# Patient Record
Sex: Female | Born: 1975 | Hispanic: Yes | Marital: Married | Smoking: Never smoker
Health system: Southern US, Community
[De-identification: ages and names within clinical notes are randomized; demographics above are authoritative.]

## PROBLEM LIST (undated history)

## (undated) DIAGNOSIS — J45909 Unspecified asthma, uncomplicated: Secondary | ICD-10-CM

## (undated) DIAGNOSIS — N920 Excessive and frequent menstruation with regular cycle: Secondary | ICD-10-CM

## (undated) DIAGNOSIS — D649 Anemia, unspecified: Secondary | ICD-10-CM

## (undated) DIAGNOSIS — T7840XA Allergy, unspecified, initial encounter: Secondary | ICD-10-CM

## (undated) HISTORY — DX: Anemia, unspecified: D64.9

## (undated) HISTORY — DX: Excessive and frequent menstruation with regular cycle: N92.0

---

## 2009-07-29 ENCOUNTER — Other Ambulatory Visit: Admission: RE | Admit: 2009-07-29 | Discharge: 2009-07-29 | Payer: Self-pay | Admitting: Gynecology

## 2009-07-29 ENCOUNTER — Ambulatory Visit: Payer: Self-pay | Admitting: Gynecology

## 2010-08-04 ENCOUNTER — Other Ambulatory Visit (HOSPITAL_COMMUNITY)
Admission: RE | Admit: 2010-08-04 | Discharge: 2010-08-04 | Disposition: A | Discharge status: Home / Self Care | Payer: 59 | Source: Ambulatory Visit | Attending: Gynecology | Admitting: Gynecology

## 2010-08-04 ENCOUNTER — Ambulatory Visit
Admission: RE | Admit: 2010-08-04 | Discharge: 2010-08-04 | Payer: Self-pay | Source: Home / Self Care | Attending: Gynecology | Admitting: Gynecology

## 2010-08-04 ENCOUNTER — Other Ambulatory Visit: Payer: Self-pay | Admitting: Gynecology

## 2010-08-04 DIAGNOSIS — Z124 Encounter for screening for malignant neoplasm of cervix: Secondary | ICD-10-CM | POA: Insufficient documentation

## 2011-08-07 ENCOUNTER — Encounter: Payer: 59 | Admitting: Gynecology

## 2011-09-19 ENCOUNTER — Encounter: Payer: 59 | Admitting: Gynecology

## 2011-10-12 ENCOUNTER — Encounter: Payer: 59 | Admitting: Gynecology

## 2011-11-09 ENCOUNTER — Encounter: Payer: 59 | Admitting: Gynecology

## 2012-01-24 ENCOUNTER — Encounter: Payer: 59 | Admitting: Gynecology

## 2012-03-17 ENCOUNTER — Encounter: Payer: 59 | Admitting: Gynecology

## 2012-04-15 ENCOUNTER — Encounter: Payer: 59 | Admitting: Gynecology

## 2012-05-16 ENCOUNTER — Encounter: Payer: 59 | Admitting: Gynecology

## 2012-12-19 ENCOUNTER — Encounter: Payer: Self-pay | Admitting: Gynecology

## 2013-01-22 ENCOUNTER — Encounter: Payer: Self-pay | Admitting: Gynecology

## 2013-02-18 ENCOUNTER — Encounter: Payer: Self-pay | Admitting: Gynecology

## 2013-10-21 ENCOUNTER — Ambulatory Visit (INDEPENDENT_AMBULATORY_CARE_PROVIDER_SITE_OTHER): Payer: 59 | Admitting: Gynecology

## 2013-10-21 ENCOUNTER — Other Ambulatory Visit (HOSPITAL_COMMUNITY)
Admission: RE | Admit: 2013-10-21 | Discharge: 2013-10-21 | Disposition: A | Discharge status: Home / Self Care | Payer: 59 | Source: Ambulatory Visit | Attending: Gynecology | Admitting: Gynecology

## 2013-10-21 ENCOUNTER — Encounter: Payer: Self-pay | Admitting: Gynecology

## 2013-10-21 VITALS — BP 126/84 | Ht 70.0 in | Wt 186.0 lb

## 2013-10-21 DIAGNOSIS — Z01419 Encounter for gynecological examination (general) (routine) without abnormal findings: Secondary | ICD-10-CM

## 2013-10-21 DIAGNOSIS — N841 Polyp of cervix uteri: Secondary | ICD-10-CM

## 2013-10-21 DIAGNOSIS — Z1151 Encounter for screening for human papillomavirus (HPV): Secondary | ICD-10-CM | POA: Insufficient documentation

## 2013-10-21 LAB — CBC WITH DIFFERENTIAL/PLATELET
Basophils Absolute: 0.1 10*3/uL (ref 0.0–0.1)
Basophils Relative: 2 % — ABNORMAL HIGH (ref 0–1)
Eosinophils Absolute: 0.2 10*3/uL (ref 0.0–0.7)
Eosinophils Relative: 3 % (ref 0–5)
HCT: 28.4 % — ABNORMAL LOW (ref 36.0–46.0)
Hemoglobin: 9 g/dL — ABNORMAL LOW (ref 12.0–15.0)
LYMPHS ABS: 2.1 10*3/uL (ref 0.7–4.0)
LYMPHS PCT: 40 % (ref 12–46)
MCH: 21.4 pg — ABNORMAL LOW (ref 26.0–34.0)
MCHC: 31.7 g/dL (ref 30.0–36.0)
MCV: 67.6 fL — ABNORMAL LOW (ref 78.0–100.0)
Monocytes Absolute: 0.4 10*3/uL (ref 0.1–1.0)
Monocytes Relative: 8 % (ref 3–12)
NEUTROS ABS: 2.4 10*3/uL (ref 1.7–7.7)
NEUTROS PCT: 47 % (ref 43–77)
PLATELETS: 356 10*3/uL (ref 150–400)
RBC: 4.2 MIL/uL (ref 3.87–5.11)
RDW: 18 % — ABNORMAL HIGH (ref 11.5–15.5)
WBC: 5.2 10*3/uL (ref 4.0–10.5)

## 2013-10-21 LAB — COMPREHENSIVE METABOLIC PANEL
ALBUMIN: 3.9 g/dL (ref 3.5–5.2)
ALK PHOS: 68 U/L (ref 39–117)
ALT: 10 U/L (ref 0–35)
AST: 14 U/L (ref 0–37)
BUN: 10 mg/dL (ref 6–23)
CO2: 28 mEq/L (ref 19–32)
Calcium: 8.9 mg/dL (ref 8.4–10.5)
Chloride: 105 mEq/L (ref 96–112)
Creat: 0.75 mg/dL (ref 0.50–1.10)
Glucose, Bld: 79 mg/dL (ref 70–99)
POTASSIUM: 4 meq/L (ref 3.5–5.3)
SODIUM: 138 meq/L (ref 135–145)
TOTAL PROTEIN: 6.7 g/dL (ref 6.0–8.3)
Total Bilirubin: 0.4 mg/dL (ref 0.2–1.2)

## 2013-10-21 LAB — CHOLESTEROL, TOTAL: CHOLESTEROL: 198 mg/dL (ref 0–200)

## 2013-10-21 LAB — TSH: TSH: 1.693 u[IU]/mL (ref 0.350–4.500)

## 2013-10-21 NOTE — Progress Notes (Signed)
Denise Howell 08-03-1975 [id number]   History:    38 y.o.  for annual gyn exam  Who has not been seen in the office in over 3 years. Patient reports normal menstrual cycle. 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 Her husband has had a vasectomy.  Past medical history,surgical history, family history and social history were all reviewed and documented in the EPIC chart.  Gynecologic History Patient's last menstrual period was 10/17/2013. Contraception: vasectomy Last Pap: 2012. Results were: normal Last mammogram: Not indicated. Results were: Not indicated  Obstetric History OB History  Gravida Para Term Preterm AB SAB TAB Ectopic Multiple Living  2 2        2     # Outcome Date GA Lbr Len/2nd Weight Sex Delivery Anes PTL Lv  2 PAR           1 PAR                ROS: A ROS was performed and pertinent positives and negatives are included in the history.  GENERAL: No fevers or chills. HEENT: No change in vision, no earache, sore throat or sinus congestion. NECK: No pain or stiffness. CARDIOVASCULAR: No chest pain or pressure. No palpitations. PULMONARY: No shortness of breath, cough or wheeze. GASTROINTESTINAL: No abdominal pain, nausea, vomiting or diarrhea, melena or bright red blood per rectum. GENITOURINARY: No urinary frequency, urgency, hesitancy or dysuria. MUSCULOSKELETAL: No joint or muscle pain, no back pain, no recent trauma. DERMATOLOGIC: No rash, no itching, no lesions. ENDOCRINE: No polyuria, polydipsia, no heat or cold intolerance. No recent change in weight. HEMATOLOGICAL: No anemia or easy bruising or bleeding. NEUROLOGIC: No headache, seizures, numbness, tingling or weakness. PSYCHIATRIC: No depression, no loss of interest in normal activity or change in sleep pattern.     Exam: chaperone present  BP 126/84  Ht 5\' 10"  (1.778 m)  Wt 186 lb (84.369 kg)  BMI 26.69  kg/m2  LMP 10/17/2013  Body mass index is 26.69 kg/(m^2).  General appearance : Well developed well nourished female. No acute distress HEENT: Neck supple, trachea midline, no carotid bruits, no thyroidmegaly Lungs: Clear to auscultation, no rhonchi or wheezes, or rib retractions  Heart: Regular rate and rhythm, no murmurs or gallops Breast:Examined in sitting and supine position were symmetrical in appearance, no palpable masses or tenderness,  no skin retraction, no nipple inversion, no nipple discharge, no skin discoloration, no axillary or supraclavicular lymphadenopathy Abdomen: no palpable masses or tenderness, no rebound or guarding Extremities: no edema or skin discoloration or tenderness  Pelvic:  Bartholin, Urethra, Skene Glands: Within normal limits             Vagina: No gross lesions or discharge  Cervix: No gross lesions or discharge, cervical polyp  Uterus  anteverted, normal size, shape and consistency, non-tender and mobile  Adnexa  Without masses or tenderness  Anus and perineum  normal   Rectovaginal  normal sphincter tone without palpated masses or tenderness             Hemoccult not indicated   The patient was counseled for removal of cervical polyp. The area was cleansed with Betadine solution. The polyp was grasped with a ring forceps and twisted off its pedicle. For hemostasis overnight treated and Monsel solution was used.  Assessment/Plan:  38 y.o. female for annual exam with incidental finding of a cervical  polyp which was removed and submitted for pathological evaluation. The following labs ordered today: Pap smear, CBC, screening cholesterol, TSH, comprehensive metabolic panel and urinalysis. Patient was reminded to do her monthly breast exam.  Note: This dictation was prepared with  Dragon/digital dictation along withSmart phrase technology. Any transcriptional errors that result from this process are unintentional.   Terrance Mass MD, 3:11 PM  10/21/2013

## 2013-10-21 NOTE — Patient Instructions (Signed)
Autoexamen de mamas  (Breast Self-Awareness) El autoexamen de mamas puede detectar problemas de manera temprana, prevenir complicaciones mdicas significativas y posiblemente salvar su vida. Al hacerlo, podr familiarizarse con el aspecto y forma de sus mamas, y observar cambios. Esto le permite descubrir cambios de manera precoz. Este autoexamen mensual le ofrece la tranquilidad de que sus senos estn en buen estado de salud. Una forma de aprender qu es normal para sus mamas y si sufren modificaciones es hacer un autoexamen.   Si encuentra un bulto o algo que no estaba presente anteriormente, lo mejor es ponerse en contacto con su mdico inmediatamente. Otro hallazgo que debe ser evaluado por su mdico es la secrecin del pezn, especialmente si es con sangre; cambios en la piel o enrojecimiento; reas donde la piel parece estar tironeada (retrada) o nuevos bultos o protuberancias. El dolor en los senos es rara vez se asocia con el cncer (malignidad), pero tambin debe ser evaluado por un mdico.  CMO REALIZAR EL AUTOEXAMEN DE MAMAS  El mejor momento para examinar sus mamas es a los 5 a 7 das despus de finalizado el perodo menstrual. Durante la menstruacin, las mamas estn ms abultadas y puede haber ms dificultad para encontrar modificaciones. Si no menstra, ha llegado a la menopausia, o le han extirpado el tero (histerectomia), usted debe examinar sus senos a intervalos regulares, por ejemplo cada mes. Si est amamantando, examine sus senos despus de alimentar al beb o despus de usar un extractor de leche. Los implantes mamarios no disminuyen el riesgo de bultos o tumores, por lo que debe seguir realizando el autoexamen de mamas como se recomienda. Hable con su mdico acerca de cmo determinar la diferencia entre el implante y el tejido mamario. Adems, debe consultar cuanta presin debe hacer durante el examen. Con el tiempo se familiarizar con las variaciones de las mamas y se sentir ms  cmoda para realizar el examen. Para el autoexamen deber quitarse toda la ropa de la cintura para arriba.  1.  Observe sus senos y pezones. Prese frente a un espejo en una habitacin con buena iluminacin. Con las manos en las caderas, presione las manos firmemente hacia abajo. Busque diferencias en la forma, el contorno y el tamao de un pecho al otro (asimetras). Entre las asimetras se incluyen arrugas, depresiones o protuberancias. Tambin, busque cambios en la piel, como reas enrojecidas o escamosas. Busque cambios en los pezones, como secreciones, hoyuelos, cambios en la posicin, o enrojecimiento. 2. Palpe cuidadosamente sus senos. Es mucho mejor hacerlo en la ducha o en la baera, mientras usa jabn o cuando est recostada sobre su espalda. Coloque el brazo (en el lado de la mama que se examina) por arriba de la cabeza. Use las yemas (no las puntas) de los tres dedos centrales de la mano opuesta para palpar. Comience en la zona de la axila, haga crculos de  de pulgada (2 cm) y vaya superponindolos. Utilice 3 niveles diferentes de presin (ligero, medio y firme) en cada crculo antes de pasar al siguiente. Se necesita una presin ligera para sentir los tejidos ms cercanos a la piel. La presin media ayudar a sentir el tejido mamario un poco ms profundo, mientras que se necesita una presin firme para palpar el tejido que se encuentra cerca de las costillas. Continuar superponiendo crculos y vaya hacia abajo, hasta sentir las costillas, por debajo del pecho. Luego mueva un espacio del ancho de un dedo hacia el centro del cuerpo. Siga con los crculos del  de pulgada (  2 cm) mientras va lentamente hacia la clavcula, cerca de la base del cuello. Contine con el examen hacia arriba y hacia abajo con las 3 intensidades de presin hasta llegar a la mitad del pecho. Hgalo con cada seno cuidadosamente, buscando bultos o modificaciones. 3. Debe llevar un registro escrito con los cambios o los hallazgos  normales que encuentre para cada seno. Si registra esta informacin, no tiene que depender slo de la memoria para recordar el tamao, la sensibilidad o la ubicacin de los hallazgos. Anote en qu momento se encuentra del ciclo menstrual, si usted todava est menstruando. El tejido mamario puede tener algunos bultos o tejidos engrosados. Sin embargo, consulte a su mdico si usted encuentra algo que le preocupa.   SOLICITE ATENCIN MDICA SI:   Observa cambios en la forma, en el contorno o el tamao de las mamas o los pezones.   Hay modificaciones en la piel, como zonas enrojecidas o escamosas en las mamas o en los pezones.   Tiene una secrecin anormal en los pezones.   Siente un nuevo bulto o reas engrosadas de manera anormal.  Document Released: 06/25/2005 Document Revised: 06/11/2012 [employer] Patient Information 2014 [employer].  

## 2013-10-22 ENCOUNTER — Other Ambulatory Visit: Payer: Self-pay | Admitting: Gynecology

## 2013-10-22 DIAGNOSIS — D649 Anemia, unspecified: Secondary | ICD-10-CM

## 2013-10-22 LAB — URINALYSIS W MICROSCOPIC + REFLEX CULTURE
BACTERIA UA: NONE SEEN
BILIRUBIN URINE: NEGATIVE
CRYSTALS: NONE SEEN
Casts: NONE SEEN
Glucose, UA: NEGATIVE mg/dL
KETONES UR: NEGATIVE mg/dL
Leukocytes, UA: NEGATIVE
Nitrite: NEGATIVE
Protein, ur: NEGATIVE mg/dL
SPECIFIC GRAVITY, URINE: 1.02 (ref 1.005–1.030)
UROBILINOGEN UA: 0.2 mg/dL (ref 0.0–1.0)
pH: 6 (ref 5.0–8.0)

## 2013-10-23 ENCOUNTER — Other Ambulatory Visit: Payer: 59

## 2013-10-23 DIAGNOSIS — D649 Anemia, unspecified: Secondary | ICD-10-CM

## 2013-10-23 LAB — CBC WITH DIFFERENTIAL/PLATELET
BASOS ABS: 0.1 10*3/uL (ref 0.0–0.1)
Basophils Relative: 2 % — ABNORMAL HIGH (ref 0–1)
Eosinophils Absolute: 0.4 10*3/uL (ref 0.0–0.7)
Eosinophils Relative: 8 % — ABNORMAL HIGH (ref 0–5)
HCT: 29.8 % — ABNORMAL LOW (ref 36.0–46.0)
Hemoglobin: 9.2 g/dL — ABNORMAL LOW (ref 12.0–15.0)
Lymphocytes Relative: 36 % (ref 12–46)
Lymphs Abs: 1.9 10*3/uL (ref 0.7–4.0)
MCH: 21.1 pg — ABNORMAL LOW (ref 26.0–34.0)
MCHC: 30.9 g/dL (ref 30.0–36.0)
MCV: 68.5 fL — AB (ref 78.0–100.0)
MONO ABS: 0.4 10*3/uL (ref 0.1–1.0)
Monocytes Relative: 7 % (ref 3–12)
Neutro Abs: 2.5 10*3/uL (ref 1.7–7.7)
Neutrophils Relative %: 47 % (ref 43–77)
PLATELETS: 375 10*3/uL (ref 150–400)
RBC: 4.35 MIL/uL (ref 3.87–5.11)
RDW: 17.5 % — AB (ref 11.5–15.5)
WBC: 5.3 10*3/uL (ref 4.0–10.5)

## 2013-10-23 LAB — VITAMIN B12: Vitamin B-12: 582 pg/mL (ref 211–911)

## 2013-10-23 LAB — URINE CULTURE: Colony Count: 25000

## 2013-10-23 LAB — IRON AND TIBC
%SAT: 3 % — ABNORMAL LOW (ref 20–55)
IRON: 13 ug/dL — AB (ref 42–145)
TIBC: 442 ug/dL (ref 250–470)
UIBC: 429 ug/dL — ABNORMAL HIGH (ref 125–400)

## 2013-10-23 LAB — FOLATE: Folate: 12.5 ng/mL

## 2013-10-23 LAB — FERRITIN: Ferritin: 1 ng/mL — ABNORMAL LOW (ref 10–291)

## 2013-10-27 ENCOUNTER — Other Ambulatory Visit: Payer: Self-pay | Admitting: Gynecology

## 2013-10-27 DIAGNOSIS — D649 Anemia, unspecified: Secondary | ICD-10-CM

## 2013-11-17 ENCOUNTER — Other Ambulatory Visit: Payer: 59 | Admitting: Anesthesiology

## 2013-11-17 DIAGNOSIS — Z1211 Encounter for screening for malignant neoplasm of colon: Secondary | ICD-10-CM

## 2014-01-15 ENCOUNTER — Other Ambulatory Visit: Payer: 59

## 2014-05-10 ENCOUNTER — Encounter: Payer: Self-pay | Admitting: Gynecology

## 2014-10-25 ENCOUNTER — Encounter: Payer: Self-pay | Admitting: Gynecology

## 2014-11-11 ENCOUNTER — Encounter: Payer: Self-pay | Admitting: Gynecology

## 2014-12-17 ENCOUNTER — Encounter: Payer: Self-pay | Admitting: Gynecology

## 2014-12-24 ENCOUNTER — Encounter: Payer: Self-pay | Admitting: Gynecology

## 2014-12-24 ENCOUNTER — Ambulatory Visit (INDEPENDENT_AMBULATORY_CARE_PROVIDER_SITE_OTHER): Payer: 59 | Admitting: Gynecology

## 2014-12-24 VITALS — BP 128/80 | Ht 71.0 in | Wt 191.0 lb

## 2014-12-24 DIAGNOSIS — R635 Abnormal weight gain: Secondary | ICD-10-CM

## 2014-12-24 DIAGNOSIS — Z01419 Encounter for gynecological examination (general) (routine) without abnormal findings: Secondary | ICD-10-CM

## 2014-12-24 NOTE — Progress Notes (Signed)
Denise Howell 21-Jan-1976 [id number]   History:    39 y.o.  for annual gyn exam with no complaints today. Patient stated many years ago she had HPV on on one of her Pap smear and subsequent patches of been normal. Her husband has had a vasectomy. She reports normal menstrual cycles.  Past medical history,surgical history, family history and social history were all reviewed and documented in the EPIC chart.  Gynecologic History Patient's last menstrual period was 12/17/2014. Contraception: vasectomy Last Pap: 2015. Results were: normal Last mammogram: Not indicated. Results were: Not indicated  Obstetric History OB History  Gravida Para Term Preterm AB SAB TAB Ectopic Multiple Living  2 2        2     # Outcome Date GA Lbr Len/2nd Weight Sex Delivery Anes PTL Lv  2 Para           1 Para                ROS: A ROS was performed and pertinent positives and negatives are included in the history.  GENERAL: No fevers or chills. HEENT: No change in vision, no earache, sore throat or sinus congestion. NECK: No pain or stiffness. CARDIOVASCULAR: No chest pain or pressure. No palpitations. PULMONARY: No shortness of breath, cough or wheeze. GASTROINTESTINAL: No abdominal pain, nausea, vomiting or diarrhea, melena or bright red blood per rectum. GENITOURINARY: No urinary frequency, urgency, hesitancy or dysuria. MUSCULOSKELETAL: No joint or muscle pain, no back pain, no recent trauma. DERMATOLOGIC: No rash, no itching, no lesions. ENDOCRINE: No polyuria, polydipsia, no heat or cold intolerance. No recent change in weight. HEMATOLOGICAL: No anemia or easy bruising or bleeding. NEUROLOGIC: No headache, seizures, numbness, tingling or weakness. PSYCHIATRIC: No depression, no loss of interest in normal activity or change in sleep pattern.     Exam: chaperone present  BP 128/80 mmHg  Ht 5\' 11"  (1.803 m)  Wt 191 lb (86.637 kg)  BMI 26.65 kg/m2  LMP 12/17/2014  Body mass index is 26.65  kg/(m^2).  General appearance : Well developed well nourished female. No acute distress HEENT: Eyes: no retinal hemorrhage or exudates,  Neck supple, trachea midline, no carotid bruits, no thyroidmegaly Lungs: Clear to auscultation, no rhonchi or wheezes, or rib retractions  Heart: Regular rate and rhythm, no murmurs or gallops Breast:Examined in sitting and supine position were symmetrical in appearance, no palpable masses or tenderness,  no skin retraction, no nipple inversion, no nipple discharge, no skin discoloration, no axillary or supraclavicular lymphadenopathy Abdomen: no palpable masses or tenderness, no rebound or guarding Extremities: no edema or skin discoloration or tenderness  Pelvic:  Bartholin, Urethra, Skene Glands: Within normal limits             Vagina: No gross lesions or discharge  Cervix: No gross lesions or discharge  Uterus  anteverted, normal size, shape and consistency, non-tender and mobile  Adnexa  Without masses or tenderness  Anus and perineum  normal   Rectovaginal  normal sphincter tone without palpated masses or tenderness             Hemoccult  not indicated    Assessment/Plan:  39 y.o. female for annual exam will return back to the office next week in a fasting state for the following screening labs: Comprehensive metabolic panel, TSH, CBC, fasting lipid profile, and urinalysis. Pap smear not indicated this year according to the guidelines. Next year patient will need a baseline mammogram. Literature information in Spanish was  provided on cholesterol lowering diet as well as exercise   Terrance Mass MD, 2:19 PM 12/24/2014

## 2014-12-24 NOTE — Patient Instructions (Signed)
Control del colesterol  Los niveles de colesterol en el organismo estn determinados significativamente por su dieta. Los niveles de colesterol tambin se relacionan con la enfermedad cardaca. El material que sigue ayuda a explicar esta relacin y a analizar qu puede hacer para mantener su corazn sano. No todo el colesterol es malo. Las lipoprotenas de baja densidad (LDL) forman el colesterol "malo". El colesterol malo puede ocasionar depsitos de grasa que se acumulan en el interior de las arterias. Las lipoprotenas de alta densidad (HDL) es el colesterol "bueno". Ayuda a remover el colesterol LDL "malo" de la sangre. El colesterol es un factor de riesgo muy importante para la enfermedad cardaca. Otros factores de riesgo son la hipertensin arterial, el hbito de fumar, el estrs, la herencia y el peso.   El msculo cardaco obtiene el suministro de sangre a travs de las arterias coronarias. Si su colesterol LDL ("malo") est elevado y el HDL ("bueno") es bajo, tiene un factor de riesgo para que se formen depsitos de grasa en las arterias coronarias (los vasos sanguneos que suministran sangre al corazn). Esto hace que haya menos lugar para que la sangre circule. Sin la suficiente sangre y oxgeno, el msculo cardaco no puede funcionar correctamente, y usted podr sentir dolores en el pecho (angina pectoris). Cuando una arteria coronaria se cierra completamente, una parte del msculo cardaco puede morir (infarto de miocardio).  CONTROL DEL COLESTEROL Cuando el profesional que lo asiste enva la sangre al laboratorio para conocer el nivel de colesterol, puede realizarle tambin un perfil completo de los lpidos. Con esta prueba, se puede determinar la cantidad total de colesterol, as como los niveles de LDL y HDL. Los triglicridos son un tipo de grasa que circula en la sangre y que tambin puede utilizarse  para determinar el riesgo de enfermedad cardaca. En la siguiente tabla se establecen los nmeros ideales: Prueba: Colesterol total  Menos de 200 mg/dl.  Prueba: LDL "colesterol malo"  Menos de 100 mg/dl.   Menos de 70 mg/dl si tiene riesgo muy elevado de sufrir un ataque cardaco o muerte cardaca sbita.  Prueba: HDL "colesterol bueno"  Mujeres: Ms de 50 mg/dl.   Hombres: Ms de 40 mg/dl.  Prueba: Trigliceridos  Menos de 150 mg/dl.    CONTROL DEL COLESTEROL CON DIETA Aunque factores como el ejercicio y el estilo de vida son importantes, la "primera lnea de ataque" es la dieta. Esto se debe a que se sabe que ciertos alimentos hacen subir el colesterol y otros lo bajan. El objetivo debe ser equilibrar los alimentos, de modo que tengan un efecto sobre el colesterol y, an ms importante, reemplazar las grasas saturadas y trans con otros tipos de grasas, como las monoinsaturadas y las poliinsaturadas y cidos grasos omega-3 . En promedio, una persona no debe consumir ms de 15 a 17 g de grasas saturadas por da. Las grasas saturadas y trans se consideran grasas "malas", ya que elevan el colesterol LDL. Las grasas saturadas se encuentran principalmente en productos animales como carne, manteca y crema. Pero esto no significa que usted debe sacrificar todas sus comidas favoritas. Actualmente, como lo muestra el cuadro que figura al final de este documento, hay sustitutos de buen sabor, bajos en grasas y en colesterol, para la mayora de los alimentos que a usted le gusta comer. Elija aquellos alimentos alternativos que sean bajos en grasas o sin grasas. Elija cortes de carne del cuarto trasero o lomo ya que estos cortes son los que tienen menor cantidad de grasa   y colesterol. El pollo (sin piel), el pescado, la carne de ternera, y la pechuga de pavo molida son excelentes opciones. Elimine las carnes grasosas como los hotdogs o el salami. Los mariscos tienen poco o nada de grasas saturadas. Cuando  consuma carne magra, carne de aves de corral, o pescado, hgalo en porciones de 85 gramos (3 onzas). Las grasas trans tambin se llaman "aceites parcialmente hidrogenados". Son aceites manipulados cientficamente de modo que son slidos a temperatura ambiente, tienen una larga vida y mejoran el sabor y la textura de los alimentos a los que se agregan. Las grasas trans se encuentran en la margarina, masitas, crackers y alimentos horneados.  Para hornear y cocinar, el aceite es un excelente sustituto para la mantequilla. Los aceites monoinsaturados tienen un beneficio particular, ya que se cree que disminuyen el colesterol LDL (colesterol malo) y elevan el HDL. Deber evitar los aceites tropicales saturados como el de coco y el de palma.  Recuerde, adems, que puede comer sin restricciones los grupos de alimentos que son naturalmente libres de grasas saturadas y grasas trans, entre los que se incluyen el pescado, las frutas (excepto el aguacate), verduras, frijoles, cereales (cebada, arroz, cuzcuz, trigo) y las pastas (sin salsas con crema)   IDENTIFIQUE LOS ALIMENTOS QUE DISMINUYEN EL COLESTEROL  Pueden disminuir el colesterol las fibras solubles que estn en las frutas, como las manzanas, en los vegetales como el brcoli, las patatas y las zanahorias; en las legumbres como frijoles, guisantes y lentejas; y en los cereales como la cebada. Los alimentos fortificados con fitosteroles tambin pueden disminuir el colesterol. Debe consumir al menos 2 g de estos alimentos a diario para obtener el efecto de disminucin de colesterol.  En el supermercado, lea las etiquetas de los envases para identificar los alimentos bajos en grasas saturadas, libres de grasas trans y bajos en grasas, . Elija quesos que tengan solo de 2 a 3 g de grasa saturada por onza (28,35 g). Use una margarina que no dae el corazn, libre de grasas trans o aceite parcialmente hidrogenado. Al comprar alimentos horneados (galletitas dulces y  galletas) evite el aceite parcialmente hidrogenado. Los panes y bollos debern ser de granos enteros (harina de maz o de avena entera, en lugar de "harina" o "harina enriquecida"). Compre sopas en lata que no sean cremosas, con bajo contenido de sal y sin grasas adicionadas.   TCNICAS DE PREPARACIN DE LOS ALIMENTOS  Nunca fra los alimentos en aceite abundante. Si debe frer, hgalo en poco aceite y removiendo constantemente, porque as se utilizan muy pocas grasas, o utilice un spray antiadherente. Cuando le sea posible, hierva, hornee o ase las carnes y cocine los vegetales al vapor. En vez de aderezar los vegetales con mantequilla o margarina, utilice limn y hierbas, pur de manzanas y canela (para las calabazas y batatas), yogurt y salsa descremados y aderezos para ensaladas bajos en contenido graso.   BAJO EN GRASAS SATURADAS / SUSTITUTOS BAJOS EN GRASA  Carnes / Grasas saturadas (g)  Evite: Bife, corte graso (3 oz/85 g) / 11 g   Elija: Bife, corte magro (3 oz/85 g) / 4 g   Evite: Hamburguesa (3 oz/85 g) / 7 g   Elija:  [place]amburguesa magra (3 oz/85 g) / 5 g   Evite: Jamn (3 oz/85 g) / 6 g   Elija:  Jamn magro (3 oz/85 g) / 2.4 g   Evite: Pollo, con piel (3 oz/85 g), Carne oscura / 4 g   Elija:  Pollo, sin piel (  3 oz/85 g), Carne oscura / 2 g   Evite: Pollo, con piel (3 oz/85 g), Carne magra / 2.5 g   Elija: Pollo, sin piel (3 oz/85 g), Carne magra / 1 g  Lcteos / Grasas saturadas (g)  Evite: Leche entera (1 taza) / 5 g   Elija: Leche con bajo contenido de grasa, 2% (1 taza) / 3 g   Elija: Leche con bajo contenido de grasa, 1% (1 taza) / 1.5 g   Elija: Leche descremada (1 taza) / 0.3 g   Evite: Queso duro (1 oz/28 g) / 6 g   Elija: Queso descremado (1 oz/28 g) / 2-3 g   Evite: Queso cottage, 4% grasa (1 taza)/ 6.5 g   Elija: Queso cottage con bajo contenido de grasa, 1% grasa (1 taza)/ 1.5 g   Evite: Helado (1 taza) / 9 g   Elija: Sorbete (1 taza) / 2.5 g   Elija: Yogurt helado sin contenido de  grasa (1 taza) / 0.3 g   Elija: Barras de fruta congeladas / vestigios   Evite: Crema batida (1 cucharada) / 3.5 g   Elija: Batidos glac sin lcteos (1 cucharada) / 1 g  Condimentos / Grasas saturadas (g)  Evite: Mayonesa (1 cucharada) / 2 g   Elija: Mayonesa con bajo contenido de grasa (1 cucharada) / 1 g   Evite: Manteca (1 cucharada) / 7 g   Elija: Margarina extra light (1 cucharada) / 1 g   Evite: Aceite de coco (1 cucharada) / 11.8 g   Elija: Aceite de oliva (1 cucharada) / 1.8 g   Elija: Aceite de maz (1 cucharada) / 1.7 g   Elija: Aceite de crtamo (1 cucharada) / 1.2 g   Elija: Aceite de girasol (1 cucharada) / 1.4 g   Elija: Aceite de soja (1 cucharada) / 2.4 g   Elija: Aceite de canola (1 cucharada) / 1 g  Document Released: 06/25/2005 Document Revised: 03/07/2011 [facility] Patient Information 2012 [place], [place]. Ejercicios para perder peso (Exercise to Lose Weight) La actividad fsica y Ardelia Mems dieta saludable ayudan a perder peso. El mdico podr sugerirle ejercicios especficos. IDEAS Y CONSEJOS PARA HACER EJERCICIOS  Elija opciones econmicas que disfrute hacer , como caminar, andar en bicicleta o los vdeos para ejercitarse.   Utilice las Clinical cytogeneticist del ascensor.   Camine durante la hora del almuerzo.   Estacione el auto lejos del lugar de [place] o [place].   Concurra a un gimnasio o tome clases de gimnasia.   Comience con 5  10 minutos de actividad fsica por da. Ejercite hasta 30 minutos, 4 a 6 das por semana.   Utilice zapatos que tengan un buen soporte y ropas cmodas.   Elongue antes y despus de Chief Technology Officer.   Ejercite hasta que aumente la respiracin y el corazn palpite rpido.   Beba agua extra cuando ejercite.   No haga ejercicio Contractor, sentirse mareado o que le falte mucho el aire.  La actividad fsica puede quemar alrededor de 150 caloras.  Correr 20 cuadras en 15 minutos.   Jugar vley durante 45 a 60  minutos.   Limpiar y encerar el auto durante 45 a 60 minutos.   Jugar ftbol americano de toque.   Caminar 25 cuadras en 35 minutos.   Empujar un cochecito 20 cuadras en 30 minutos.   Jugar baloncesto durante 30 minutos.   Rastrillar hojas secas durante 30 minutos.   Andar en bicicleta 80 cuadras en 30 minutos.  Caminar 30 cuadras en 30 minutos.   Bailar durante 30 minutos.   Quitar la nieve con una pala durante 15 minutos.   Nadar vigorosamente durante 20 minutos.   Subir escaleras durante 15 minutos.   Andar en bicicleta 60 cuadras durante 15 minutos.   Arreglar el jardn entre 30 y 67 minutos.   Saltar a la soga durante 15 minutos.   Limpiar vidrios o pisos durante 45 a 60 minutos.  Document Released: 09/29/2010 Document Revised: 03/07/2011 [facility] Patient Information 2012 [place].

## 2014-12-31 ENCOUNTER — Other Ambulatory Visit: Payer: 59

## 2014-12-31 DIAGNOSIS — R635 Abnormal weight gain: Secondary | ICD-10-CM

## 2014-12-31 DIAGNOSIS — Z01419 Encounter for gynecological examination (general) (routine) without abnormal findings: Secondary | ICD-10-CM

## 2014-12-31 LAB — LIPID PANEL
CHOLESTEROL: 187 mg/dL (ref 0–200)
HDL: 54 mg/dL (ref >=46)
LDL CALC: 123 mg/dL — AB (ref 0–99)
Total CHOL/HDL Ratio: 3.5 Ratio
Triglycerides: 52 mg/dL (ref <150)
VLDL: 10 mg/dL (ref 0–40)

## 2014-12-31 LAB — CBC WITH DIFFERENTIAL/PLATELET
Basophils Absolute: 0.1 10*3/uL (ref 0.0–0.1)
Basophils Relative: 2 % — ABNORMAL HIGH (ref 0–1)
Eosinophils Absolute: 0.3 10*3/uL (ref 0.0–0.7)
Eosinophils Relative: 5 % (ref 0–5)
HEMATOCRIT: 27.9 % — AB (ref 36.0–46.0)
Hemoglobin: 8.2 g/dL — ABNORMAL LOW (ref 12.0–15.0)
Lymphocytes Relative: 38 % (ref 12–46)
Lymphs Abs: 2 10*3/uL (ref 0.7–4.0)
MCH: 18.8 pg — ABNORMAL LOW (ref 26.0–34.0)
MCHC: 29.4 g/dL — ABNORMAL LOW (ref 30.0–36.0)
MCV: 64 fL — ABNORMAL LOW (ref 78.0–100.0)
MONOS PCT: 9 % (ref 3–12)
MPV: 8.9 fL (ref 8.6–12.4)
Monocytes Absolute: 0.5 10*3/uL (ref 0.1–1.0)
Neutro Abs: 2.4 10*3/uL (ref 1.7–7.7)
Neutrophils Relative %: 46 % (ref 43–77)
Platelets: 398 10*3/uL (ref 150–400)
RBC: 4.36 MIL/uL (ref 3.87–5.11)
RDW: 18.7 % — AB (ref 11.5–15.5)
WBC: 5.3 10*3/uL (ref 4.0–10.5)

## 2014-12-31 LAB — COMPREHENSIVE METABOLIC PANEL
ALT: 9 U/L (ref 0–35)
AST: 13 U/L (ref 0–37)
Albumin: 3.7 g/dL (ref 3.5–5.2)
Alkaline Phosphatase: 67 U/L (ref 39–117)
BILIRUBIN TOTAL: 0.4 mg/dL (ref 0.2–1.2)
BUN: 14 mg/dL (ref 6–23)
CO2: 24 meq/L (ref 19–32)
Calcium: 8.5 mg/dL (ref 8.4–10.5)
Chloride: 105 mEq/L (ref 96–112)
Creat: 0.62 mg/dL (ref 0.50–1.10)
GLUCOSE: 81 mg/dL (ref 70–99)
POTASSIUM: 4.3 meq/L (ref 3.5–5.3)
SODIUM: 138 meq/L (ref 135–145)
Total Protein: 6.7 g/dL (ref 6.0–8.3)

## 2015-01-01 LAB — URINALYSIS W MICROSCOPIC + REFLEX CULTURE
BACTERIA UA: NONE SEEN
BILIRUBIN URINE: NEGATIVE
CRYSTALS: NONE SEEN
Casts: NONE SEEN
Glucose, UA: NEGATIVE mg/dL
Hgb urine dipstick: NEGATIVE
Ketones, ur: NEGATIVE mg/dL
LEUKOCYTES UA: NEGATIVE
Nitrite: NEGATIVE
Protein, ur: NEGATIVE mg/dL
SPECIFIC GRAVITY, URINE: 1.022 (ref 1.005–1.030)
Urobilinogen, UA: 0.2 mg/dL (ref 0.0–1.0)
pH: 6 (ref 5.0–8.0)

## 2015-01-01 LAB — TSH: TSH: 1.43 u[IU]/mL (ref 0.350–4.500)

## 2015-01-03 ENCOUNTER — Other Ambulatory Visit: Payer: Self-pay | Admitting: Gynecology

## 2015-01-03 DIAGNOSIS — D62 Acute posthemorrhagic anemia: Secondary | ICD-10-CM

## 2015-02-11 ENCOUNTER — Other Ambulatory Visit: Payer: 59

## 2015-02-11 DIAGNOSIS — D62 Acute posthemorrhagic anemia: Secondary | ICD-10-CM

## 2015-02-11 LAB — CBC WITH DIFFERENTIAL/PLATELET
BASOS ABS: 0.1 10*3/uL (ref 0.0–0.1)
BASOS PCT: 2 % — AB (ref 0–1)
Eosinophils Absolute: 0.3 10*3/uL (ref 0.0–0.7)
Eosinophils Relative: 5 % (ref 0–5)
HEMATOCRIT: 27.8 % — AB (ref 36.0–46.0)
HEMOGLOBIN: 8.2 g/dL — AB (ref 12.0–15.0)
Lymphocytes Relative: 33 % (ref 12–46)
Lymphs Abs: 2 10*3/uL (ref 0.7–4.0)
MCH: 18.5 pg — ABNORMAL LOW (ref 26.0–34.0)
MCHC: 29.5 g/dL — ABNORMAL LOW (ref 30.0–36.0)
MCV: 62.8 fL — ABNORMAL LOW (ref 78.0–100.0)
MONOS PCT: 8 % (ref 3–12)
MPV: 8.6 fL (ref 8.6–12.4)
Monocytes Absolute: 0.5 10*3/uL (ref 0.1–1.0)
NEUTROS ABS: 3.2 10*3/uL (ref 1.7–7.7)
NEUTROS PCT: 52 % (ref 43–77)
Platelets: 399 10*3/uL (ref 150–400)
RBC: 4.43 MIL/uL (ref 3.87–5.11)
RDW: 19 % — ABNORMAL HIGH (ref 11.5–15.5)
WBC: 6.2 10*3/uL (ref 4.0–10.5)

## 2015-02-14 ENCOUNTER — Telehealth: Payer: Self-pay | Admitting: *Deleted

## 2015-02-14 ENCOUNTER — Other Ambulatory Visit: Payer: Self-pay | Admitting: Gynecology

## 2015-02-14 DIAGNOSIS — D62 Acute posthemorrhagic anemia: Secondary | ICD-10-CM

## 2015-02-14 DIAGNOSIS — D500 Iron deficiency anemia secondary to blood loss (chronic): Secondary | ICD-10-CM

## 2015-02-14 NOTE — Telephone Encounter (Signed)
-----   Message from Ramond Craver, [place] sent at 02/14/2015 12:39 PM EDT ----- Regarding: Referral to [facility] Per Dr. JF.-She reported normal menstrual cycles to we need to investigate why she is anemic and for this reason I need for you to make an appointment with [facility] for further work up. (hgb was 8.2  Been really low x one year.)

## 2015-02-14 NOTE — Telephone Encounter (Signed)
Referral placed, they will contact pt to schedule.

## 2015-03-17 ENCOUNTER — Other Ambulatory Visit: Payer: Self-pay

## 2015-03-21 ENCOUNTER — Encounter: Payer: Self-pay | Admitting: Gynecology

## 2015-03-21 ENCOUNTER — Other Ambulatory Visit: Payer: 59

## 2015-03-21 DIAGNOSIS — D500 Iron deficiency anemia secondary to blood loss (chronic): Secondary | ICD-10-CM

## 2015-03-21 LAB — IRON AND TIBC
%SAT: 4 % — ABNORMAL LOW (ref 11–50)
Iron: 17 ug/dL — ABNORMAL LOW (ref 40–190)
TIBC: 403 ug/dL (ref 250–450)
UIBC: 386 ug/dL (ref 125–400)

## 2015-03-21 LAB — FOLATE: Folate: 10.2 ng/mL

## 2015-03-21 LAB — FERRITIN: FERRITIN: 5 ng/mL — AB (ref 10–291)

## 2015-03-21 LAB — VITAMIN B12: Vitamin B-12: 677 pg/mL (ref 211–911)

## 2015-03-22 ENCOUNTER — Telehealth: Payer: Self-pay | Admitting: *Deleted

## 2015-03-22 NOTE — Telephone Encounter (Signed)
-----   Message from Ramond Craver, [place] sent at 03/22/2015  2:33 PM EDT ----- Regarding: hematology referral Per Dr. Moshe Salisbury "Please inform patient that she has  iron deficiency anemia and appears to be chronic in nature. I would like for her to see the hematologist. Meanwhile would like her to begin taking one iron tablet twice a day. Please make an appointment for her with the hematologist oncologist."  Patient informed. Thanks!

## 2015-03-22 NOTE — Telephone Encounter (Signed)
[facility], [facility] will contact pt to schedule.

## 2015-03-24 ENCOUNTER — Telehealth: Payer: Self-pay | Admitting: Hematology

## 2015-03-24 NOTE — Telephone Encounter (Signed)
new patient appt-s/w patient  and gave np appt for 09/26 @ 2:45 w/Dr. Irene Limbo Referring Dr. Donalynn Furlong Dx- Tonia [place]

## 2015-03-28 NOTE — Telephone Encounter (Signed)
Appointment 9/26 @ 3:00pm

## 2015-04-04 ENCOUNTER — Other Ambulatory Visit: Payer: 59

## 2015-04-04 ENCOUNTER — Ambulatory Visit (HOSPITAL_BASED_OUTPATIENT_CLINIC_OR_DEPARTMENT_OTHER): Payer: 59 | Admitting: Hematology

## 2015-04-04 ENCOUNTER — Encounter: Payer: Self-pay | Admitting: Hematology

## 2015-04-04 ENCOUNTER — Telehealth: Payer: Self-pay | Admitting: Hematology

## 2015-04-04 VITALS — BP 125/61 | HR 82 | Temp 98.2°F | Resp 18 | Ht 71.0 in | Wt 197.6 lb

## 2015-04-04 DIAGNOSIS — D509 Iron deficiency anemia, unspecified: Secondary | ICD-10-CM | POA: Diagnosis not present

## 2015-04-04 NOTE — Telephone Encounter (Signed)
Gave adn printed appt sched and avs for pt for OCT and DEC .Marland Kitchen..dec per pt request

## 2015-04-04 NOTE — Progress Notes (Signed)
HEMATOLOGY/ONCOLOGY CONSULTATION NOTE  Date of Service: 04/04/2015  Patient Care Team: Pcp Not In System as PCP - General  CHIEF COMPLAINTS/PURPOSE OF CONSULTATION:  Evaluation and management of iron deficiency anemia  HISTORY OF PRESENTING ILLNESS:  Denise Howell is a wonderful 39 y.o. female who has been referred to [place] by Dr Uvaldo Rising for evaluation and management of iron deficiency anemia.  She is a 39 year old Hispanic female with no known chronic medical comorbidities who reports increased fatigue over the last 1-2 years and was diagnosed with iron deficiency anemia about a year ago. Notes that since she is crossed age 54 years her periods have been heavier with passage of clots. She was noted to be iron deficient about a year ago and initially tried over-the-counter ferrous sulfate that caused significant nausea and vomiting and GI distress. She was switched to slow release iron as per her GYN doctor and tried this twice a day as well as once a day but was still having significant GI distress. Overall she has tolerated only about 2 months of iron and has been off any oral iron for the last month. She notes that there is no way she could take even one tablet of oral iron due to the discomfort associated with it.  Patient notes no history of anemia as a child. No known family history of blood problems or bleeding problems or clotting problems.  No other overt evidence of blood loss. No melena no hematochezia no abdominal pain no epistaxis no hemoptysis no hematuria. Notes that she tolerates wheat and other gluten products without any problems. No change in bowel habits. No issues with chronic diarrhea.  Notes that she has had significant cravings for ice. She had recent labs as per her GYN doctor which showed a hemoglobin of 8.2 with an MCV of 62.8 and RDW of 19. Ferritin level was 5 and 1 year back was 1. Iron saturation 4%. Folate and B12 within normal limits.  We  discussed the option of placing her iron IV due to relative failure of oral iron and complete failure to tolerate further oral iron. She is agreeable to doing this. We discussed about the pros and cons of IV iron and she provided informed consent. Note significant fatigue.  MEDICAL HISTORY:  Past Medical History  Diagnosis Date  . Anemia     SURGICAL HISTORY: History reviewed. No pertinent past surgical history.  SOCIAL HISTORY: Social History   Social History  . Marital Status: Married    Spouse Name: N/A  . Number of Children: N/A  . Years of Education: N/A   Occupational History  . Not on file.   Social History Main Topics  . Smoking status: Never Smoker   . Smokeless tobacco: Never Used  . Alcohol Use: No  . Drug Use: No  . 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 Not on file   Social History Narrative    FAMILY HISTORY: Family History  Problem Relation Age of Onset  . Hypertension Father   . Cancer Father     KIDNEY  . Breast cancer Maternal Grandmother   . Heart disease Maternal Grandmother     ALLERGIES:  has No Known Allergies.  MEDICATIONS:  No current outpatient prescriptions on file.   No current facility-administered medications for this visit.    REVIEW OF SYSTEMS:    10 Point review  of Systems was done is negative except as noted above.  PHYSICAL EXAMINATION: ECOG PERFORMANCE STATUS: 1 - Symptomatic but completely ambulatory  . Filed Vitals:   04/04/15 1500  Height: 5\' 11"  (1.803 m)  Weight: 197 lb 9.6 oz (89.631 kg)   Filed Weights   04/04/15 1500  Weight: 197 lb 9.6 oz (89.631 kg)   .Body mass index is 27.57 kg/(m^2).  GENERAL:alert, in no acute distress and comfortable SKIN: skin color, texture, turgor are normal, no rashes or significant lesions EYES: normal, conjunctiva are pink and non-injected, sclera clear OROPHARYNX:no exudate,  no erythema and lips, buccal mucosa, and tongue normal  NECK: supple, no JVD, thyroid normal size, non-tender, without nodularity LYMPH:  no palpable lymphadenopathy in the cervical, axillary or inguinal LUNGS: clear to auscultation with normal respiratory effort HEART: regular rate & rhythm,  no murmurs and no lower extremity edema ABDOMEN: abdomen soft, non-tender, normoactive bowel sounds  Musculoskeletal: no cyanosis of digits and no clubbing  PSYCH: alert & oriented x 3 with fluent speech NEURO: no focal motor/sensory deficits  LABORATORY DATA:  I have reviewed the data as listed  . CBC Latest Ref Rng 02/11/2015 12/31/2014 10/23/2013  WBC 4.0 - 10.5 K/uL 6.2 5.3 5.3  Hemoglobin 12.0 - 15.0 g/dL 8.2(L) 8.2(L) 9.2(L)  Hematocrit 36.0 - 46.0 % 27.8(L) 27.9(L) 29.8(L)  Platelets 150 - 400 K/uL 399 398 375    . CMP Latest Ref Rng 12/31/2014 10/21/2013  Glucose 70 - 99 mg/dL 81 79  BUN 6 - 23 mg/dL 14 10  Creatinine 0.50 - 1.10 mg/dL 0.62 0.75  Sodium 135 - 145 mEq/L 138 138  Potassium 3.5 - 5.3 mEq/L 4.3 4.0  Chloride 96 - 112 mEq/L 105 105  CO2 19 - 32 mEq/L 24 28  Calcium 8.4 - 10.5 mg/dL 8.5 8.9  Total Protein 6.0 - 8.3 g/dL 6.7 6.7  Total Bilirubin 0.2 - 1.2 mg/dL 0.4 0.4  Alkaline Phos 39 - 117 U/L 67 68  AST 0 - 37 U/L 13 14  ALT 0 - 35 U/L 9 10     RADIOGRAPHIC STUDIES: I have personally reviewed the radiological images as listed and agreed with the findings in the report. No results found.  ASSESSMENT & PLAN:    39 year old Hispanic female with no medical comorbidities with  #1 chronic symptomatic microcytic anemia due to severe iron deficiency. This is likely due to ongoing iron losses in the form of heavy menstrual periods and likely limited oral intake of iron. She tried oral iron with ferrous sulfate slow release iron which she could not tolerate due to significant GI distress even at low dose. No significant increase in her ferritin levels with oral  iron.  Plan -We discussed the pros and cons of IV iron replacement in the possibility of severe allergic reaction in a small population patients. -She provided informed consent to proceed with IV iron replacement. -She will be set up for 1 dose weekly 2 of IV Feraheme with Benadryl and Solu-Medrol premedications given her history of asthma and seasonal allergies. -Patient will return to care with Dr. Irene Limbo in 2 months with CBC, CMP, ferritin, iron profile -Follow-up with GYN for control and management of heavy periods -If she is getting iron deficient despite control of periods might be a GI workup with EGD/colonoscopy as per primary care physician.  All of her questions weranswered to her apparent satisfaction. The patient knows to call the clinic with any problems, questions or concerns.  I spent  30 minutes counseling the patient face to face. The total time spent in the appointment was 45 minutes and more than 50% was on counseling and direct patient cares.    Sullivan Lone MD Newborn AAHIVMS [facility] Hematology/Oncology Physician [facility]  (Office):       [phone] (Work cell):  [phone] (Fax):           [phone]  04/04/2015 3:14 PM

## 2015-04-12 ENCOUNTER — Ambulatory Visit (HOSPITAL_BASED_OUTPATIENT_CLINIC_OR_DEPARTMENT_OTHER): Payer: 59

## 2015-04-12 VITALS — BP 111/72 | HR 66 | Temp 98.2°F | Resp 18

## 2015-04-12 DIAGNOSIS — D509 Iron deficiency anemia, unspecified: Secondary | ICD-10-CM

## 2015-04-12 MED ORDER — SODIUM CHLORIDE 0.9 % IJ SOLN
10.0000 mL | INTRAMUSCULAR | Status: AC | PRN
  Filled 2015-04-12: qty 10

## 2015-04-12 MED ORDER — ALTEPLASE 2 MG IJ SOLR
2.0000 mg | Freq: Once | INTRAMUSCULAR | Status: AC | PRN
  Filled 2015-04-12: qty 2

## 2015-04-12 MED ORDER — SODIUM CHLORIDE 0.9 % IJ SOLN
3.0000 mL | Freq: Once | INTRAMUSCULAR | Status: AC | PRN
  Filled 2015-04-12: qty 10

## 2015-04-12 MED ORDER — METHYLPREDNISOLONE SODIUM SUCC 125 MG IJ SOLR
INTRAMUSCULAR | Status: AC
  Filled 2015-04-12: qty 2

## 2015-04-12 MED ORDER — METHYLPREDNISOLONE SODIUM SUCC 125 MG IJ SOLR
60.0000 mg | Freq: Every day | INTRAMUSCULAR | Status: AC
Start: 2015-04-12 — End: 2015-04-16
  Administered 2015-04-12: 60 mg via INTRAVENOUS

## 2015-04-12 MED ORDER — HEPARIN SOD (PORK) LOCK FLUSH 100 UNIT/ML IV SOLN
500.0000 [IU] | Freq: Once | INTRAVENOUS | Status: AC | PRN
  Filled 2015-04-12: qty 5

## 2015-04-12 MED ORDER — SODIUM CHLORIDE 0.9 % IV SOLN
510.0000 mg | Freq: Once | INTRAVENOUS | Status: AC
  Administered 2015-04-12: 510 mg via INTRAVENOUS
  Filled 2015-04-12: qty 17

## 2015-04-12 MED ORDER — HEPARIN SOD (PORK) LOCK FLUSH 100 UNIT/ML IV SOLN
250.0000 [IU] | Freq: Once | INTRAVENOUS | Status: AC | PRN
Start: 2015-04-12 — End: ?
  Filled 2015-04-12: qty 5

## 2015-04-12 MED ORDER — DIPHENHYDRAMINE HCL 25 MG PO CAPS
25.0000 mg | ORAL_CAPSULE | Freq: Once | ORAL | Status: AC
  Administered 2015-04-12: 25 mg via ORAL

## 2015-04-12 MED ORDER — SODIUM CHLORIDE 0.9 % IV SOLN
Freq: Once | INTRAVENOUS | Status: AC
  Administered 2015-04-12: 16:00:00 via INTRAVENOUS

## 2015-04-12 MED ORDER — DIPHENHYDRAMINE HCL 25 MG PO CAPS
ORAL_CAPSULE | ORAL | Status: AC
  Filled 2015-04-12: qty 1

## 2015-04-12 NOTE — Patient Instructions (Signed)

## 2015-04-19 ENCOUNTER — Ambulatory Visit (HOSPITAL_BASED_OUTPATIENT_CLINIC_OR_DEPARTMENT_OTHER): Payer: 59

## 2015-04-19 VITALS — BP 119/89 | HR 69 | Temp 98.0°F | Resp 18

## 2015-04-19 DIAGNOSIS — D509 Iron deficiency anemia, unspecified: Secondary | ICD-10-CM | POA: Diagnosis not present

## 2015-04-19 MED ORDER — DIPHENHYDRAMINE HCL 25 MG PO CAPS
25.0000 mg | ORAL_CAPSULE | Freq: Once | ORAL | Status: AC
  Administered 2015-04-19: 25 mg via ORAL

## 2015-04-19 MED ORDER — SODIUM CHLORIDE 0.9 % IV SOLN
510.0000 mg | Freq: Once | INTRAVENOUS | Status: AC
  Administered 2015-04-19: 510 mg via INTRAVENOUS
  Filled 2015-04-19: qty 17

## 2015-04-19 MED ORDER — METHYLPREDNISOLONE SODIUM SUCC 125 MG IJ SOLR
60.0000 mg | Freq: Every day | INTRAMUSCULAR | Status: DC
  Administered 2015-04-19: 60 mg via INTRAVENOUS

## 2015-04-19 MED ORDER — DIPHENHYDRAMINE HCL 25 MG PO CAPS
ORAL_CAPSULE | ORAL | Status: AC
  Filled 2015-04-19: qty 1

## 2015-04-19 MED ORDER — SODIUM CHLORIDE 0.9 % IV SOLN
Freq: Once | INTRAVENOUS | Status: AC
  Administered 2015-04-19: 14:00:00 via INTRAVENOUS

## 2015-04-19 MED ORDER — METHYLPREDNISOLONE SODIUM SUCC 125 MG IJ SOLR
INTRAMUSCULAR | Status: AC
  Filled 2015-04-19: qty 2

## 2015-04-19 NOTE — Patient Instructions (Signed)
Ferumoxytol injection What is this medicine? FERUMOXYTOL is an iron complex. Iron is used to make healthy red blood cells, which carry oxygen and nutrients throughout the body. This medicine is used to treat iron deficiency anemia in people with chronic kidney disease. This medicine may be used for other purposes; ask your health care provider or pharmacist if you have questions. COMMON BRAND NAME(S): Feraheme What should I tell my health care provider before I take this medicine? They need to know if you have any of these conditions: -anemia not caused by low iron levels -high levels of iron in the blood -magnetic resonance imaging (MRI) test scheduled -an unusual or allergic reaction to iron, other medicines, foods, dyes, or preservatives -pregnant or trying to get pregnant -breast-feeding How should I use this medicine? This medicine is for injection into a vein. It is given by a health care professional in a hospital or clinic setting. Talk to your pediatrician regarding the use of this medicine in children. Special care may be needed. Overdosage: If you think you've taken too much of this medicine contact a poison control center or emergency room at once. Overdosage: If you think you have taken too much of this medicine contact a poison control center or emergency room at once. NOTE: This medicine is only for you. Do not share this medicine with others. What if I miss a dose? It is important not to miss your dose. Call your doctor or health care professional if you are unable to keep an appointment. What may interact with this medicine? This medicine may interact with the following medications: -other iron products This list may not describe all possible interactions. Give your health care provider a list of all the medicines, herbs, non-prescription drugs, or dietary supplements you use. Also tell them if you smoke, drink alcohol, or use illegal drugs. Some items may interact with your  medicine. What should I watch for while using this medicine? Visit your doctor or healthcare professional regularly. Tell your doctor or healthcare professional if your symptoms do not start to get better or if they get worse. You may need blood work done while you are taking this medicine. You may need to follow a special diet. Talk to your doctor. Foods that contain iron include: whole grains/cereals, dried fruits, beans, or peas, leafy green vegetables, and organ meats (liver, kidney). What side effects may I notice from receiving this medicine? Side effects that you should report to your doctor or health care professional as soon as possible: -allergic reactions like skin rash, itching or hives, swelling of the face, lips, or tongue -breathing problems -changes in blood pressure -feeling faint or lightheaded, falls -fever or chills -flushing, sweating, or hot feelings -swelling of the ankles or feet Side effects that usually do not require medical attention (Report these to your doctor or health care professional if they continue or are bothersome.): -diarrhea -headache -nausea, vomiting -stomach pain This list may not describe all possible side effects. Call your doctor for medical advice about side effects. You may report side effects to FDA at 1-800-FDA-1088. Where should I keep my medicine? This drug is given in a hospital or clinic and will not be stored at home. NOTE: This sheet is a summary. It may not cover all possible information. If you have questions about this medicine, talk to your doctor, pharmacist, or health care provider.  2015, Elsevier/Gold Standard. (2012-02-08 15:23:36) Ferumoxytol injection What is this medicine? FERUMOXYTOL is an iron complex. Iron is used  to make healthy red blood cells, which carry oxygen and nutrients throughout the body. This medicine is used to treat iron deficiency anemia in people with chronic kidney disease. This medicine may be used for  other purposes; ask your health care provider or pharmacist if you have questions. What should I tell my health care provider before I take this medicine? They need to know if you have any of these conditions: -anemia not caused by low iron levels -high levels of iron in the blood -magnetic resonance imaging (MRI) test scheduled -an unusual or allergic reaction to iron, other medicines, foods, dyes, or preservatives -pregnant or trying to get pregnant -breast-feeding How should I use this medicine? This medicine is for injection into a vein. It is given by a health care professional in a hospital or clinic setting. Talk to your pediatrician regarding the use of this medicine in children. Special care may be needed. Overdosage: If you think you have taken too much of this medicine contact a poison control center or emergency room at once. NOTE: This medicine is only for you. Do not share this medicine with others. What if I miss a dose? It is important not to miss your dose. Call your doctor or health care professional if you are unable to keep an appointment. What may interact with this medicine? This medicine may interact with the following medications: -other iron products This list may not describe all possible interactions. Give your health care provider a list of all the medicines, herbs, non-prescription drugs, or dietary supplements you use. Also tell them if you smoke, drink alcohol, or use illegal drugs. Some items may interact with your medicine. What should I watch for while using this medicine? Visit your doctor or healthcare professional regularly. Tell your doctor or healthcare professional if your symptoms do not start to get better or if they get worse. You may need blood work done while you are taking this medicine. You may need to follow a special diet. Talk to your doctor. Foods that contain iron include: whole grains/cereals, dried fruits, beans, or peas, leafy green  vegetables, and organ meats (liver, kidney). What side effects may I notice from receiving this medicine? Side effects that you should report to your doctor or health care professional as soon as possible: -allergic reactions like skin rash, itching or hives, swelling of the face, lips, or tongue -breathing problems -changes in blood pressure -feeling faint or lightheaded, falls -fever or chills -flushing, sweating, or hot feelings -swelling of the ankles or feet Side effects that usually do not require medical attention (Report these to your doctor or health care professional if they continue or are bothersome.): -diarrhea -headache -nausea, vomiting -stomach pain This list may not describe all possible side effects. Call your doctor for medical advice about side effects. You may report side effects to FDA at 1-800-FDA-1088. Where should I keep my medicine? This drug is given in a hospital or clinic and will not be stored at home. NOTE: This sheet is a summary. It may not cover all possible information. If you have questions about this medicine, talk to your doctor, pharmacist, or health care provider.    2016, Elsevier/Gold Standard. (2012-02-08 15:23:36)

## 2015-04-20 ENCOUNTER — Telehealth: Payer: Self-pay | Admitting: Hematology

## 2015-04-20 NOTE — Telephone Encounter (Signed)
S/w pt advised md OOO on 12/12. Gave new appt for 12/6 @ 3.30pm also mailed appt calendar. Pt verbalized understanding.

## 2015-06-03 ENCOUNTER — Telehealth: Payer: Self-pay | Admitting: Hematology

## 2015-06-03 NOTE — Telephone Encounter (Signed)
PAL - moved f/u from 12/6 to 12/13. Left message for patient and mailed schedule.

## 2015-06-10 ENCOUNTER — Institutional Professional Consult (permissible substitution): Payer: 59 | Admitting: Gynecology

## 2015-06-14 ENCOUNTER — Ambulatory Visit: Payer: 59 | Admitting: Hematology

## 2015-06-14 ENCOUNTER — Other Ambulatory Visit: Payer: 59

## 2015-06-20 ENCOUNTER — Encounter: Payer: Self-pay | Admitting: Gynecology

## 2015-06-20 ENCOUNTER — Ambulatory Visit (INDEPENDENT_AMBULATORY_CARE_PROVIDER_SITE_OTHER): Payer: 59

## 2015-06-20 ENCOUNTER — Other Ambulatory Visit: Payer: Self-pay | Admitting: Gynecology

## 2015-06-20 ENCOUNTER — Ambulatory Visit: Payer: 59 | Admitting: Hematology

## 2015-06-20 ENCOUNTER — Ambulatory Visit (INDEPENDENT_AMBULATORY_CARE_PROVIDER_SITE_OTHER): Payer: 59 | Admitting: Gynecology

## 2015-06-20 ENCOUNTER — Other Ambulatory Visit: Payer: 59

## 2015-06-20 VITALS — BP 126/84

## 2015-06-20 DIAGNOSIS — D251 Intramural leiomyoma of uterus: Secondary | ICD-10-CM

## 2015-06-20 DIAGNOSIS — D500 Iron deficiency anemia secondary to blood loss (chronic): Secondary | ICD-10-CM | POA: Diagnosis not present

## 2015-06-20 DIAGNOSIS — D250 Submucous leiomyoma of uterus: Secondary | ICD-10-CM | POA: Diagnosis not present

## 2015-06-20 DIAGNOSIS — N852 Hypertrophy of uterus: Secondary | ICD-10-CM | POA: Diagnosis not present

## 2015-06-20 DIAGNOSIS — N920 Excessive and frequent menstruation with regular cycle: Secondary | ICD-10-CM

## 2015-06-20 DIAGNOSIS — R938 Abnormal findings on diagnostic imaging of other specified body structures: Secondary | ICD-10-CM

## 2015-06-20 DIAGNOSIS — R9389 Abnormal findings on diagnostic imaging of other specified body structures: Secondary | ICD-10-CM

## 2015-06-20 NOTE — Progress Notes (Signed)
Patient is a 39 year old who was seen the office in June of last year for annual gynecological examination and reported normal menstrual cycles and her husband had a vasectomy. At that time her screening CBC -Demonstrated the following:  Results for SWARA, MUTSCHLER (MRN [id number]) as of 06/20/2015 10:32  Ref. Range 10/23/2013 11:21 12/31/2014 08:29  WBC Latest Ref Range: 4.0-10.5 K/uL 5.3 5.3  RBC Latest Ref Range: 3.87-5.11 MIL/uL 4.35 4.36  Hemoglobin Latest Ref Range: 12.0-15.0 g/dL 9.2 (L) 8.2 (L)  HCT Latest Ref Range: 36.0-46.0 % 29.8 (L) 27.9 (L)  MCV Latest Ref Range: 78.0-100.0 fL 68.5 (L) 64.0 (L)  MCH Latest Ref Range: 26.0-34.0 pg 21.1 (L) 18.8 (L)  MCHC Latest Ref Range: 30.0-36.0 g/dL 30.9 29.4 (L)  RDW Latest Ref Range: 11.5-15.5 % 17.5 (H) 18.7 (H)  Platelets Latest Ref Range: 150-400 K/uL 375 398  MPV Latest Ref Range: 8.6-12.4 fL  8.9  Neutrophils Latest Ref Range: 43-77 % 47 46  Lymphocytes Latest Ref Range: 12-46 % 36 38  Monocytes Relative Latest Ref Range: 3-12 % 7 9  Eosinophil Latest Ref Range: 0-5 % 8 (H) 5  Basophil Latest Ref Range: 0-1 % 2 (H) 2 (H)  NEUT# Latest Ref Range: 1.7-7.7 K/uL 2.5 2.4  Lymphocyte # Latest Ref Range: 0.7-4.0 K/uL 1.9 2.0  Monocyte # Latest Ref Range: 0.1-1.0 K/uL 0.4 0.5  Eosinophils Absolute Latest Ref Range: 0.0-0.7 K/uL 0.4 0.3  Basophils Absolute Latest Ref Range: 0.0-0.1 K/uL 0.1 0.1    She was referred to the hematologist oncologist who had repeated her CBC on August 5 and her anemia panel on September 12 with the following result:  Results for DELLORA, JOYCE (MRN [id number]) as of 06/20/2015 10:32  Ref. Range 02/11/2015 10:02  WBC Latest Ref Range: 4.0-10.5 K/uL 6.2  RBC Latest Ref Range: 3.87-5.11 MIL/uL 4.43  Hemoglobin Latest Ref Range: 12.0-15.0 g/dL 8.2 (L)  HCT Latest Ref Range: 36.0-46.0 % 27.8 (L)  MCV Latest Ref Range: 78.0-100.0 fL 62.8 (L)  MCH Latest Ref Range: 26.0-34.0 pg 18.5 (L)  MCHC Latest  Ref Range: 30.0-36.0 g/dL 29.5 (L)  RDW Latest Ref Range: 11.5-15.5 % 19.0 (H)  Platelets Latest Ref Range: 150-400 K/uL 399  MPV Latest Ref Range: 8.6-12.4 fL 8.6  Neutrophils Latest Ref Range: 43-77 % 52  Lymphocytes Latest Ref Range: 12-46 % 33  Monocytes Relative Latest Ref Range: 3-12 % 8  Eosinophil Latest Ref Range: 0-5 % 5  Basophil Latest Ref Range: 0-1 % 2 (H)  NEUT# Latest Ref Range: 1.7-7.7 K/uL 3.2  Lymphocyte # Latest Ref Range: 0.7-4.0 K/uL 2.0  Monocyte # Latest Ref Range: 0.1-1.0 K/uL 0.5  Eosinophils Absolute Latest Ref Range: 0.0-0.7 K/uL 0.3  Basophils Absolute Latest Ref Range: 0.0-0.1 K/uL 0.1  Smear Review Unknown Criteria for revi...    Anemia panel: Results for HALIMAH, BRAFFORD (MRN [id number]) as of 06/20/2015 10:32  Ref. Range 03/21/2015 08:30  Iron Latest Ref Range: 40-190 ug/dL 17 (L)  UIBC Latest Ref Range: 125-400 ug/dL 386  TIBC Latest Ref Range: 250-450 ug/dL 403  %SAT Latest Ref Range: 11-50 % 4 (L)  Ferritin Latest Ref Range: 10-291 ng/mL 5 (L)  Folate Latest Units: ng/mL 10.2  Vitamin B-12 Latest Ref Range: 211-911 pg/mL 677   Patient is assessment was that she needed IV iron replacement as a result of her chronic symptomatic microcytic anemia due to severe iron deficiency which is probably attributed to her menorrhagia which she has recently reported. Patient had been  on iron supplementation in the past. He's considering also upper GI and lower GI evaluation.  We did an ultrasound today and the following was noted:  Uterus measured 12.5 x 10.9 x 9.3 cm with endometrial stripe a 28 mm. Several fibroids the largest one measuring 4.3 x 4.0 x 5.3 mm. And a fibroid suggesting possibility of it being submucous location measuring 6.7 x 5.0 x 6.5 mm both right and left ovary were normal no fluid in the cul-de-sac.  Assessment/plan: 39 year old with menorrhagia and iron deficiency anemia received IV iron 2 is scheduled to see the hematologist tomorrow.  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 Her hematologist will be checking her CBC in the next few days and we'll review that when she comes for sonohysterogram.

## 2015-06-20 NOTE — Patient Instructions (Signed)
Fibromas uterinos (Uterine Fibroids) Los fibromas uterinos son masas (tumores) de tejido que pueden desarrollarse en el vientre (tero). Tambin se los conoce como liomiomas. Este tipo de tumor no es canceroso (benigno) y no se disemina a otras partes del cuerpo fuera de la zona plvica, la cual se encuentra entre los huesos de la cadera. En ocasiones, los fibromas pueden crecer en las trompas de [place]op[place], en el cuello del tero o en las estructuras de soporte (ligamentos) que rodean el tero. Una mujer puede tener uno o ms fibromas. Los fibromas pueden tener diferente tamao y peso, y crecer en distintas partes del tero. Algunos pueden crecer hasta volverse bastante grandes. La mayora no requiere tratamiento mdico. CAUSAS Un fibroma puede desarrollarse cuando una nica clula uterina contina creciendo (se multiplica). La mayora de las clulas del cuerpo humano tienen un mecanismo de control que impide que se multipliquen sin control. SIGNOS Y SNTOMAS Entre los sntomas se pueden incluir los siguientes:   Hemorragias intensas durante la menstruacin.  Prdidas de sangre o hemorragias entre los perodos.  Dolor y opresin en la pelvis.  Problemas de la vejiga, como necesidad de orinar con ms frecuencia (polaquiuria) o necesidad imperiosa de orinar.  Incapacidad para reproducir (infertilidad).  Abortos espontneos. DIAGNSTICO Los fibromas uterinos se diagnostican con un examen fsico. El mdico puede palpar los tumores grumosos durante un examen plvico. Pueden realizarse ecografas y una resonancia magntica para determinar el tamao y la ubicacin de los fibromas, as como la cantidad. TRATAMIENTO El tratamiento puede incluir lo siguiente:  Observacin cautelosa. Esto requiere que el mdico controle el fibroma para saber si crece o se achica. Siga las recomendaciones del mdico respecto de la frecuencia con la que debe realizarse los controles.  Medicamentos hormonales. Pueden  tomarse por va oral o administrarse a travs de un dispositivo intrauterino (DIU).  Ciruga.  Extirpacin de los fibromas (miomectoma) o del tero (histerectoma).  Suprimir la irrigacin sangunea a los fibromas (embolizacin de la arteria uterina). Si los fibromas le traen problemas de fertilidad y tiene deseos de quedar embarazada, el mdico puede recomendar su extirpacin.  INSTRUCCIONES PARA EL CUIDADO EN EL HOGAR  Concurra a todas las visitas de control como se lo haya indicado el mdico. Esto es importante.  Tome los medicamentos solamente como se lo haya indicado el mdico.  Si le recetaron un tratamiento hormonal, tome los medicamentos hormonales exactamente como se lo indicaron.  No tome aspirina, ya que puede causar hemorragias.  Consulte al mdico sobre tomar comprimidos de hierro y aumentar la cantidad de verduras de hoja color verde oscuro en la dieta. Estas medidas pueden ayudar a incrementar los niveles de hierro en la sangre, que pueden verse afectados por las hemorragias menstruales intensas.  Preste mucha atencin a la menstruacin e informe al mdico si hay algn cambio, por ejemplo:  Aumento del flujo de sangre que le exige el uso de ms compresas o tampones que los que utiliza normalmente cada mes.  Un cambio en la cantidad de das que le dura la menstruacin cada mes.  Un cambio en los sntomas asociados con la menstruacin, como clicos abdominales o dolor de espalda. SOLICITE ATENCIN MDICA SI:  Tiene dolor plvico, dolor de espalda o clicos abdominales que los medicamentos no pueden controlar.  Observa un aumento del sangrado entre y durante las menstruaciones.  Empapa los tampones o las compresas en el trmino de media hora o menos tiempo.  Se siente mareada, muy cansada o dbil. SOLICITE ATENCIN MDICA DE INMEDIATO   SI:  Se desmaya.  El dolor plvico aumenta repentinamente.   Esta informacin no tiene como fin reemplazar el consejo del mdico.  Asegrese de hacerle al mdico cualquier pregunta que tenga.   Document Released: 06/25/2005 Document Revised: 07/16/2014 Elsevier Interactive Patient Education 2016 [employer].  

## 2015-06-21 ENCOUNTER — Ambulatory Visit (HOSPITAL_BASED_OUTPATIENT_CLINIC_OR_DEPARTMENT_OTHER): Payer: 59 | Admitting: Hematology

## 2015-06-21 ENCOUNTER — Other Ambulatory Visit (HOSPITAL_COMMUNITY)
Admission: RE | Admit: 2015-06-21 | Discharge: 2015-06-21 | Disposition: A | Discharge status: Home / Self Care | Payer: 59 | Source: Ambulatory Visit | Attending: Hematology | Admitting: Hematology

## 2015-06-21 ENCOUNTER — Encounter: Payer: Self-pay | Admitting: Hematology

## 2015-06-21 ENCOUNTER — Other Ambulatory Visit (HOSPITAL_BASED_OUTPATIENT_CLINIC_OR_DEPARTMENT_OTHER): Payer: 59

## 2015-06-21 VITALS — BP 123/72 | HR 83 | Temp 98.3°F | Resp 16 | Wt 194.0 lb

## 2015-06-21 DIAGNOSIS — D500 Iron deficiency anemia secondary to blood loss (chronic): Secondary | ICD-10-CM

## 2015-06-21 DIAGNOSIS — N920 Excessive and frequent menstruation with regular cycle: Secondary | ICD-10-CM

## 2015-06-21 DIAGNOSIS — D509 Iron deficiency anemia, unspecified: Secondary | ICD-10-CM

## 2015-06-21 LAB — COMPREHENSIVE METABOLIC PANEL
ALBUMIN: 4 g/dL (ref 3.5–5.0)
ALK PHOS: 63 U/L (ref 38–126)
ALT: 14 U/L (ref 14–54)
AST: 18 U/L (ref 15–41)
Anion gap: 6 (ref 5–15)
BILIRUBIN TOTAL: 0.2 mg/dL — AB (ref 0.3–1.2)
BUN: 20 mg/dL (ref 6–20)
CALCIUM: 8.9 mg/dL (ref 8.9–10.3)
CO2: 25 mmol/L (ref 22–32)
CREATININE: 0.79 mg/dL (ref 0.44–1.00)
Chloride: 109 mmol/L (ref 101–111)
GFR calc Af Amer: >60 mL/min (ref >60)
GLUCOSE: 95 mg/dL (ref 65–99)
Potassium: 4 mmol/L (ref 3.5–5.1)
Sodium: 140 mmol/L (ref 135–145)
TOTAL PROTEIN: 6.8 g/dL (ref 6.5–8.1)

## 2015-06-21 LAB — CBC & DIFF AND RETIC
BASO%: 1.4 % (ref 0.0–2.0)
Basophils Absolute: 0.1 10*3/uL (ref 0.0–0.1)
EOS%: 6.3 % (ref 0.0–7.0)
Eosinophils Absolute: 0.4 10*3/uL (ref 0.0–0.5)
HCT: 35.4 % (ref 34.8–46.6)
HGB: 11.8 g/dL (ref 11.6–15.9)
Immature Retic Fract: 8.8 % (ref 1.60–10.00)
LYMPH#: 2 10*3/uL (ref 0.9–3.3)
LYMPH%: 34.3 % (ref 14.0–49.7)
MCH: 27.8 pg (ref 25.1–34.0)
MCHC: 33.3 g/dL (ref 31.5–36.0)
MCV: 83.5 fL (ref 79.5–101.0)
MONO#: 0.6 10*3/uL (ref 0.1–0.9)
MONO%: 10.2 % (ref 0.0–14.0)
NEUT#: 2.7 10*3/uL (ref 1.5–6.5)
NEUT%: 47.8 % (ref 38.4–76.8)
PLATELETS: 259 10*3/uL (ref 145–400)
RBC: 4.24 10*6/uL (ref 3.70–5.45)
RDW: 18.2 % — ABNORMAL HIGH (ref 11.2–14.5)
Retic %: 0.87 % (ref 0.70–2.10)
Retic Ct Abs: 36.89 10*3/uL (ref 33.70–90.70)
WBC: 5.7 10*3/uL (ref 3.9–10.3)

## 2015-06-21 NOTE — Progress Notes (Signed)
HEMATOLOGY/ONCOLOGY CLINIC  NOTE  Date of Service: 06/21/2015  Patient Care Team: Pcp Not In System as PCP - General  CHIEF COMPLAINTS/PURPOSE OF CONSULTATION: follow-up plan deficiency anemia  Diagnosis: Iron deficiency anemia due to menorrhagia  Treatment: Status post IV Feraheme 510 mg 2 doses on 04/12/2015 and 04/19/2015  Interval history  [name] is here for her scheduled follow-up for iron deficiency anemia after receiving 2 doses of Feraheme in October 2016. She notes that her energy level is improved after the IV iron and her ice cravings completely disappeared. She notes that she is still having significant amounts of menorrhagia. She had an ultrasound on 06/20/2015 which showed multiple large uterine fibroids including a possible submucous fibroid. These findings likely explain the etiology of her menorrhagia. Her endometrial strip was thickened at 28 mm and she has been scheduled for a sonohysterogram on 07/25/2015 by Dr. Keturah Barre hemoglobin has improved from 8.2 to 11.8 with normalization of her MCV. Her ferritin level is still 7 due to ongoing heavy losses.   MEDICAL HISTORY:  Past Medical History  Diagnosis Date  . Anemia   . Anemia   . Menorrhagia     SURGICAL HISTORY: No past surgical history on file.  SOCIAL HISTORY: Social History   Social History  . Marital Status: Married    Spouse Name: N/A  . Number of Children: N/A  . Years of Education: N/A   Occupational History  . Not on file.   Social History Main Topics  . Smoking status: Never Smoker   . Smokeless tobacco: Never Used  . Alcohol Use: No  . Drug Use: No  . 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 Not on file   Social History Narrative    FAMILY HISTORY: Family History  Problem Relation Age of Onset  . Hypertension Father   . Cancer Father     KIDNEY  . Breast cancer Maternal  Grandmother   . Heart disease Maternal Grandmother     ALLERGIES:  has No Known Allergies.  MEDICATIONS:  No current outpatient prescriptions on file.   No current facility-administered medications for this visit.   Facility-Administered Medications Ordered in Other Visits  Medication Dose Route Frequency Provider Last Rate Last Dose  . alteplase (CATHFLO ACTIVASE) injection 2 mg  2 mg Intracatheter Once PRN Brunetta Genera, MD      . heparin lock flush 100 unit/mL  500 Units Intracatheter Once PRN Brunetta Genera, MD      . heparin lock flush 100 unit/mL  250 Units Intracatheter Once PRN Brunetta Genera, MD      . sodium chloride 0.9 % injection 10 mL  10 mL Intracatheter PRN Brunetta Genera, MD      . sodium chloride 0.9 % injection 3 mL  3 mL Intravenous Once PRN [place], MD        REVIEW OF SYSTEMS:    10 Point review of Systems was done is negative except as noted above.  PHYSICAL EXAMINATION: ECOG PERFORMANCE STATUS: 1 - Symptomatic but completely ambulatory  . Filed Vitals:   06/21/15 1544  Weight: 194 lb (87.998 kg)   Filed Weights   06/21/15 1544  Weight: 194 lb (87.998 kg)   .Body mass index is 27.07 kg/(m^2).  GENERAL:alert, in no acute distress and comfortable SKIN: skin color, texture, turgor are normal, no  rashes or significant lesions EYES: normal, conjunctiva are pink and non-injected, sclera clear OROPHARYNX:no exudate, no erythema and lips, buccal mucosa, and tongue normal  NECK: supple, no JVD, thyroid normal size, non-tender, without nodularity LYMPH:  no palpable lymphadenopathy in the cervical, axillary or inguinal LUNGS: clear to auscultation with normal respiratory effort HEART: regular rate & rhythm,  no murmurs and no lower extremity edema ABDOMEN: abdomen soft, non-tender, normoactive bowel sounds  Musculoskeletal: no cyanosis of digits and no clubbing  PSYCH: alert & oriented x 3 with fluent speech NEURO: no  focal motor/sensory deficits  LABORATORY DATA:  I have reviewed the data as listed  . CBC Latest Ref Rng 06/21/2015 02/11/2015 12/31/2014  WBC 3.9 - 10.3 10e3/uL 5.7 6.2 5.3  Hemoglobin 11.6 - 15.9 g/dL 11.8 8.2(L) 8.2(L)  Hematocrit 34.8 - 46.6 % 35.4 27.8(L) 27.9(L)  Platelets 145 - 400 10e3/uL 259 399 398   CBC    Component Value Date/Time   WBC 5.7 06/21/2015 1558   WBC 6.2 02/11/2015 1002   RBC 4.24 06/21/2015 1558   RBC 4.43 02/11/2015 1002   HGB 11.8 06/21/2015 1558   HGB 8.2* 02/11/2015 1002   HCT 35.4 06/21/2015 1558   HCT 27.8* 02/11/2015 1002   PLT 259 06/21/2015 1558   PLT 399 02/11/2015 1002   MCV 83.5 06/21/2015 1558   MCV 62.8* 02/11/2015 1002   MCH 27.8 06/21/2015 1558   MCH 18.5* 02/11/2015 1002   MCHC 33.3 06/21/2015 1558   MCHC 29.5* 02/11/2015 1002   RDW 18.2* 06/21/2015 1558   RDW 19.0* 02/11/2015 1002   LYMPHSABS 2.0 06/21/2015 1558   LYMPHSABS 2.0 02/11/2015 1002   MONOABS 0.6 06/21/2015 1558   MONOABS 0.5 02/11/2015 1002   EOSABS 0.4 06/21/2015 1558   EOSABS 0.3 02/11/2015 1002   BASOSABS 0.1 06/21/2015 1558   BASOSABS 0.1 02/11/2015 1002      . CMP Latest Ref Rng 12/31/2014 10/21/2013  Glucose 70 - 99 mg/dL 81 79  BUN 6 - 23 mg/dL 14 10  Creatinine 0.50 - 1.10 mg/dL 0.62 0.75  Sodium 135 - 145 mEq/L 138 138  Potassium 3.5 - 5.3 mEq/L 4.3 4.0  Chloride 96 - 112 mEq/L 105 105  CO2 19 - 32 mEq/L 24 28  Calcium 8.4 - 10.5 mg/dL 8.5 8.9  Total Protein 6.0 - 8.3 g/dL 6.7 6.7  Total Bilirubin 0.2 - 1.2 mg/dL 0.4 0.4  Alkaline Phos 39 - 117 U/L 67 68  AST 0 - 37 U/L 13 14  ALT 0 - 35 U/L 9 10   . Lab Results  Component Value Date   IRON 35* 06/21/2015   TIBC 349 06/21/2015   IRONPCTSAT 10* 06/21/2015   (Iron and TIBC)  Lab Results  Component Value Date   FERRITIN 7* 06/21/2015      RADIOGRAPHIC STUDIES: I have personally reviewed the radiological images as listed and agreed with the findings in the report. US Transvaginal  Non-ob  06/20/2015  Uterus measured 12.5 x 10.9 x 9.3 cm with endometrial stripe a 28 mm. Several fibroids the largest one measuring 4.3 x 4.0 x 5.3 mm. And a fibroid suggesting possibility of it being submucous location measuring 6.7 x 5.0 x 6.5 mm both right and left ovary were normal no fluid in the cul-de-sac.   ASSESSMENT & PLAN:    39 year old Hispanic female with no medical comorbidities with  #1 chronic symptomatic microcytic anemia due to severe iron deficiency. This is likely due to ongoing iron  losses in the form of heavy menstrual periods and likely limited oral intake of iron. She tried oral iron with ferrous sulfate slow release iron which she could not tolerate due to significant GI distress even at low dose. Her hemoglobin is much improved at 11.8 up from her previous hemoglobin of 8.2. MCV has normalized. Her pica symptoms characterized by ice cravings have resolved. Her ferritin level is too low at 7 due to ongoing menorrhagia.  #2 severe menorrhagia likely due to uterine fibroids. Patient is undergoing GYN evaluation to evaluate her endometrium with sonohysterogram next month.  Plan -since her ferritin level is still low at 7 and she has significant ongoing menstrual losses she was given the option of simply getting additional IV Feraheme versus consideration of oral iron. -She notes that she could not tolerate oral iron and would prefer to just get the beginning iron IV as well. -IV Feraheme 510 mg every weekly 2 additional doses has been ordered and she'll be scheduled. -Continue follow-up with GYN address severe menorrhagia. Patient has not had any other bleeding tendencies to suggest a primary bleeding disorder and she has obvious uterine pathology in the form of fibroids that would explain her symptoms and therefore a bleeding disorder workup is not warranted at this time.  Return to clinic in 3 months with Dr. Irene Limbo with CBC, CMP and iron labs  All of her questions  were answered to her apparent satisfaction. The patient knows to call the clinic with any problems, questions or concerns.  I spent 15 minutes counseling the patient face to face. The total time spent in the appointment was 15 minutes and more than 50% was on counseling and direct patient cares.    Sullivan Lone MD [place] AAHIVMS [facility] Hematology/Oncology Physician [facility]  (Office):       [phone] (Work cell):  [phone] (Fax):           [phone]  06/21/2015 3:39 PM

## 2015-06-22 ENCOUNTER — Telehealth: Payer: Self-pay | Admitting: Hematology

## 2015-06-22 LAB — IRON AND TIBC
%SAT: 10 % — AB (ref 21–57)
IRON: 35 ug/dL — AB (ref 41–142)
TIBC: 349 ug/dL (ref 236–444)
UIBC: 313 ug/dL (ref 120–384)

## 2015-06-22 LAB — FERRITIN: FERRITIN: 7 ng/mL — AB (ref 9–269)

## 2015-06-22 NOTE — Telephone Encounter (Signed)
per pof to sch pt appt-cld & left message to adv of appt tme & date

## 2015-06-24 ENCOUNTER — Telehealth: Payer: Self-pay

## 2015-06-24 ENCOUNTER — Telehealth: Payer: Self-pay | Admitting: *Deleted

## 2015-06-24 ENCOUNTER — Other Ambulatory Visit: Payer: Self-pay | Admitting: Hematology

## 2015-06-24 ENCOUNTER — Other Ambulatory Visit: Payer: Self-pay | Admitting: *Deleted

## 2015-06-24 ENCOUNTER — Telehealth: Payer: Self-pay | Admitting: Hematology

## 2015-06-24 NOTE — Telephone Encounter (Signed)
Called and left a message with iron appointments °

## 2015-06-24 NOTE — Telephone Encounter (Signed)
Called patient and lvm to let her know hgb has increased, but ferritin levels have not changed much.  Advised patient to call back to set up additional apts for IV feraheme.

## 2015-06-24 NOTE — Telephone Encounter (Signed)
Pt called returning Loren's call. No POF seen in chart to schedule further iron infusions.

## 2015-06-28 ENCOUNTER — Telehealth: Payer: Self-pay | Admitting: Hematology

## 2015-06-28 NOTE — Telephone Encounter (Signed)
pt cld to CX appt-no r/s at this time

## 2015-06-29 ENCOUNTER — Ambulatory Visit: Payer: 59

## 2015-07-06 ENCOUNTER — Ambulatory Visit: Payer: 59

## 2015-07-11 ENCOUNTER — Other Ambulatory Visit: Payer: Self-pay | Admitting: Gynecology

## 2015-07-11 DIAGNOSIS — N939 Abnormal uterine and vaginal bleeding, unspecified: Secondary | ICD-10-CM

## 2015-07-14 ENCOUNTER — Telehealth: Payer: Self-pay | Admitting: *Deleted

## 2015-07-14 NOTE — Telephone Encounter (Signed)
Called [employer] spoke with David. [employer] benefits Covered 70% by insurance patient responsible for 30% after $1300 calendar year deductible ($0met) and covered 100% after $4550 OPM is met ($0met).  Pt's responsible amount is $1145.38. KAB, RMA 

## 2015-07-25 ENCOUNTER — Ambulatory Visit: Payer: 59 | Admitting: Gynecology

## 2015-07-25 ENCOUNTER — Other Ambulatory Visit: Payer: 59

## 2015-07-27 ENCOUNTER — Encounter: Payer: Self-pay | Admitting: Gynecology

## 2015-07-27 ENCOUNTER — Ambulatory Visit (INDEPENDENT_AMBULATORY_CARE_PROVIDER_SITE_OTHER): Payer: 59

## 2015-07-27 ENCOUNTER — Ambulatory Visit (INDEPENDENT_AMBULATORY_CARE_PROVIDER_SITE_OTHER): Payer: 59 | Admitting: Gynecology

## 2015-07-27 DIAGNOSIS — D500 Iron deficiency anemia secondary to blood loss (chronic): Secondary | ICD-10-CM | POA: Diagnosis not present

## 2015-07-27 DIAGNOSIS — D251 Intramural leiomyoma of uterus: Secondary | ICD-10-CM | POA: Diagnosis not present

## 2015-07-27 DIAGNOSIS — D250 Submucous leiomyoma of uterus: Secondary | ICD-10-CM | POA: Diagnosis not present

## 2015-07-27 DIAGNOSIS — N920 Excessive and frequent menstruation with regular cycle: Secondary | ICD-10-CM | POA: Diagnosis not present

## 2015-07-27 DIAGNOSIS — N939 Abnormal uterine and vaginal bleeding, unspecified: Secondary | ICD-10-CM

## 2015-07-27 NOTE — Progress Notes (Signed)
   Patient is a 40 year old who presented to the office today as part of her evaluation of her chronic anemia. Patient with known history of fibroid uterus was here for sonohysterogram to see of her fibroid was encroaching into the cavity to possibly consider outpatient resectoscopic myomectomy versus a full blown hysterectomy. Her hemoglobin has been as low as 8.2 g on December 31 2014. She had been referred to the hematologist. Her anemia panel had demonstrated she was allowed iron and percent saturation was low as well as her ferritin level her total iron binding capacity and folate and vitamin B-12 were normal. Patient did have an ultrasound in the office on 06/20/2015 the following was noted:  Uterus measured 12.5 x 10.9 x 9.3 cm with endometrial stripe a 28 mm. Several fibroids the largest one measuring 4.3 x 4.0 x 5.3 mm. And a fibroid suggesting possibility of it being submucous location measuring 6.7 x 5.0 x 6.5 mm both right and left ovary were normal no fluid in the cul-de-sac.   Patient has received IV iron in October 4 and 04/19/2015. At the hematologist office recently her hemoglobin was reported that it was 11.8.  Her cycles last 5-7 Rosana Hoes she reports no intermenstrual bleeding. Her husband has had a vasectomy. Today's ultrasound/sono hysterogram demonstrated the following:  Uterus measured 13.7 endometrium 35.3 mm. Fibroids unchanged from previous exam. A right ovarian thinwall cyst with internal low level echoes measuring 21 x 17 mm was noted. No fluid in the cul-de-sac. Left ovary normal no fluid in the cul-de-sac. Her cervix was then cleansed with Betadine solution a sterile catheter was introduced into the uterine cavity and a 0.0 x 4.7 x 6.3 cm defect consistent with a large submucous myoma with transdermal pattern was noted encroaching the entire uterine cavity.  A sterile Pipelle was introduced after the cervix was cleansed with Betadine solution. An endometrial biopsy was obtained and  tissue submitted for histological evaluation.  Assessment/plan: 40 year old gravida 2 para 2 with symptomatic leiomyomatous uteri contributing to severe menorrhagia and anemia requiring or full to medical oncologist who has infused patient twice with iron to get her hemoglobin up to 11.8. She is going to be prescribed Lysteda 650 mg 2 tablets 3 times a day for 5 days during her cycle to cut down her bleeding. She will continue on her iron tablets orally twice a day. We are going to proceed and schedule a total abdominal hysterectomy with bilateral salpingectomy within the next few weeks. We'll recheck her CBC the week before surgery. Literature information was provided. Will notify when the results of endometrial biopsy becomes available.

## 2015-07-28 ENCOUNTER — Telehealth: Payer: Self-pay

## 2015-07-28 ENCOUNTER — Other Ambulatory Visit: Payer: Self-pay | Admitting: Gynecology

## 2015-07-28 DIAGNOSIS — D500 Iron deficiency anemia secondary to blood loss (chronic): Secondary | ICD-10-CM

## 2015-07-28 MED ORDER — TRANEXAMIC ACID 650 MG PO TABS: 1300.0000 mg | ORAL_TABLET | Freq: Three times a day (TID) | ORAL | Status: DC

## 2015-07-28 NOTE — Telephone Encounter (Signed)
Rx sent and patient reminded regarding iron tablets.

## 2015-07-28 NOTE — Telephone Encounter (Signed)
I contacted patient to schedule TAH, Bilateral Salpingectomy per Dr. Moshe Salisbury. He had requested first available but upon discussion with patient she is leaving to go to [place] at the end of January and will not return until late Feb. She requested March 28 and I scheduled her for that day.  She was scheduled for pre op consult and CBC one week prior to surgery.  I discussed ins benefits and estimated surgery prepayment to [facility] with her. I will send her a financial letter reiterating as well.   Patient also inquired about Rx Dr. Moshe Salisbury told her he was prescribing but nothing at pharmacy. I sent the Rx for Lysteda mentioned in office note to her pharmacy.

## 2015-07-28 NOTE — Telephone Encounter (Signed)
Call in prescription for Lysteda 650 mg 2 tablets 3 times a day for 5 days during her menses. Call in 30 tablets with 5 refills. Make sure she takes her iron tablet twice a day.

## 2015-07-31 ENCOUNTER — Ambulatory Visit: Payer: Self-pay | Admitting: Gynecology

## 2015-09-05 ENCOUNTER — Telehealth: Payer: Self-pay

## 2015-09-05 NOTE — Telephone Encounter (Signed)
Patient called in voice mail stating she needed to postpone her upcoming TAH until June.  She is currently scheduled 09/24/15.  I called her back and left message that I would cancel it and as soon as I get June schedule I will call her to arrange surgery for June.

## 2015-09-16 ENCOUNTER — Telehealth: Payer: Self-pay | Admitting: Hematology

## 2015-09-16 NOTE — Telephone Encounter (Signed)
pt called to r/s appt...done....pt ok and aware of new d.t °

## 2015-09-20 ENCOUNTER — Ambulatory Visit: Payer: 59 | Admitting: Hematology

## 2015-09-20 ENCOUNTER — Other Ambulatory Visit: Payer: 59

## 2015-09-26 ENCOUNTER — Institutional Professional Consult (permissible substitution): Payer: 59 | Admitting: Gynecology

## 2015-09-28 ENCOUNTER — Encounter: Payer: Self-pay | Admitting: Hematology

## 2015-09-28 ENCOUNTER — Ambulatory Visit (HOSPITAL_BASED_OUTPATIENT_CLINIC_OR_DEPARTMENT_OTHER): Payer: 59 | Admitting: Hematology

## 2015-09-28 ENCOUNTER — Other Ambulatory Visit (HOSPITAL_BASED_OUTPATIENT_CLINIC_OR_DEPARTMENT_OTHER): Payer: 59

## 2015-09-28 VITALS — BP 144/73 | HR 86 | Temp 98.3°F | Resp 18 | Ht 71.0 in | Wt 191.1 lb

## 2015-09-28 DIAGNOSIS — N920 Excessive and frequent menstruation with regular cycle: Secondary | ICD-10-CM | POA: Diagnosis not present

## 2015-09-28 DIAGNOSIS — D509 Iron deficiency anemia, unspecified: Secondary | ICD-10-CM

## 2015-09-28 DIAGNOSIS — D500 Iron deficiency anemia secondary to blood loss (chronic): Secondary | ICD-10-CM | POA: Diagnosis not present

## 2015-09-28 LAB — CBC & DIFF AND RETIC
BASO%: 1 % (ref 0.0–2.0)
BASOS ABS: 0.1 10*3/uL (ref 0.0–0.1)
EOS%: 2.4 % (ref 0.0–7.0)
Eosinophils Absolute: 0.2 10*3/uL (ref 0.0–0.5)
HEMATOCRIT: 37.2 % (ref 34.8–46.6)
HGB: 12.5 g/dL (ref 11.6–15.9)
IMMATURE RETIC FRACT: 7.9 % (ref 1.60–10.00)
LYMPH%: 31.3 % (ref 14.0–49.7)
MCH: 29 pg (ref 25.1–34.0)
MCHC: 33.6 g/dL (ref 31.5–36.0)
MCV: 86.3 fL (ref 79.5–101.0)
MONO#: 0.6 10*3/uL (ref 0.1–0.9)
MONO%: 8.4 % (ref 0.0–14.0)
NEUT#: 3.8 10*3/uL (ref 1.5–6.5)
NEUT%: 56.9 % (ref 38.4–76.8)
PLATELETS: 291 10*3/uL (ref 145–400)
RBC: 4.31 10*6/uL (ref 3.70–5.45)
RDW: 12.9 % (ref 11.2–14.5)
Retic %: 0.82 % (ref 0.70–2.10)
Retic Ct Abs: 35.34 10*3/uL (ref 33.70–90.70)
WBC: 6.7 10*3/uL (ref 3.9–10.3)
lymph#: 2.1 10*3/uL (ref 0.9–3.3)

## 2015-09-28 LAB — COMPREHENSIVE METABOLIC PANEL
ANION GAP: 9 meq/L (ref 3–11)
AST: 12 U/L (ref 5–34)
Albumin: 3.8 g/dL (ref 3.5–5.0)
Alkaline Phosphatase: 78 U/L (ref 40–150)
BUN: 14.7 mg/dL (ref 7.0–26.0)
CALCIUM: 8.9 mg/dL (ref 8.4–10.4)
CO2: 24 meq/L (ref 22–29)
Chloride: 108 mEq/L (ref 98–109)
Creatinine: 0.9 mg/dL (ref 0.6–1.1)
EGFR: 78 mL/min/{1.73_m2} — AB (ref >90)
Glucose: 103 mg/dl (ref 70–140)
Potassium: 3.7 mEq/L (ref 3.5–5.1)
Sodium: 141 mEq/L (ref 136–145)
TOTAL PROTEIN: 7.3 g/dL (ref 6.4–8.3)
Total Bilirubin: <0.3 mg/dL (ref 0.20–1.20)

## 2015-09-29 LAB — FERRITIN: Ferritin: 6 ng/ml — ABNORMAL LOW (ref 9–269)

## 2015-09-29 MED ORDER — FERROUS SULFATE 325 (65 FE) MG PO TBEC: 325.0000 mg | DELAYED_RELEASE_TABLET | Freq: Two times a day (BID) | ORAL | Status: DC

## 2015-09-29 NOTE — Progress Notes (Signed)
HEMATOLOGY/ONCOLOGY CLINIC  NOTE  Date of Service: 09/29/2015  Patient Care Team: Pcp Not In System as PCP - General  CHIEF COMPLAINTS/PURPOSE OF CONSULTATION: follow-up plan deficiency anemia  Diagnosis: Iron deficiency anemia due to menorrhagia  Treatment: Status post IV Feraheme 510 mg 2 doses on 04/12/2015 and 04/19/2015  Has been on FESO4 1 tab po bid since 06/2015 and has tolerated it with meals.  Interval history  [name] is here for her scheduled follow-up for iron deficiency anemia. She had responded well to 2 doses of Feraheme in October 2016. She had an ultrasound on 06/20/2015 which showed multiple large uterine fibroids including a possible submucous fibroid. These findings likely explain the etiology of her menorrhagia. Her endometrial strip was thickened at 28 mm and she has been scheduled for a sonohysterogram on 07/25/2015 by Dr. Toney Rakes. her hemoglobin had improved from 8.2 to 11.8 with normalization of her MCV. Her ferritin level is still 7 due to ongoing heavy losses. She was offered additional IV iron to chose to try oral iron instead.  She notes that she has been able to tolerate oral iron sulfate 1 tablet by mouth twice a day with meals.  She has been off of a hysterectomy which was initially scheduled for March 2017 but she has postponed this to June 2017.  She notes that she is feeling well at this time.  Her hemoglobin today is within normal limits.  She continues to have heavy periods.  No other acute new symptoms. Notes she is having good energy levels and has been running more.   MEDICAL HISTORY:  Past Medical History  Diagnosis Date  . Anemia   . Anemia   . Menorrhagia     SURGICAL HISTORY: History reviewed. No pertinent past surgical history.  SOCIAL HISTORY: Social History   Social History  . Marital Status: Married    Spouse Name: N/A  . Number of Children: N/A  . Years of Education: N/A   Occupational History  . Not on file.    Social History Main Topics  . Smoking status: Never Smoker   . Smokeless tobacco: Never Used  . Alcohol Use: No  . Drug Use: No  . 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 Not on file   Social History Narrative    FAMILY HISTORY: Family History  Problem Relation Age of Onset  . Hypertension Father   . Cancer Father     KIDNEY  . Breast cancer Maternal Grandmother   . Heart disease Maternal Grandmother     ALLERGIES:  has No Known Allergies.  MEDICATIONS:  Current Outpatient Prescriptions  Medication Sig Dispense Refill  . tranexamic acid (LYSTEDA) 650 MG TABS tablet Take 2 tablets (1,300 mg total) by mouth 3 (three) times daily. X 5 days starting with first sign of menses. 30 tablet 2   No current facility-administered medications for this visit.   Facility-Administered Medications Ordered in Other Visits  Medication Dose Route Frequency Provider Last Rate Last Dose  . alteplase (CATHFLO ACTIVASE) injection 2 mg  2 mg Intracatheter Once PRN Brunetta Genera, MD      . heparin lock flush 100 unit/mL  500 Units Intracatheter Once PRN Brunetta Genera, MD      . heparin lock flush 100 unit/mL  250 Units Intracatheter Once PRN Brunetta Genera, MD      .  sodium chloride 0.9 % injection 10 mL  10 mL Intracatheter PRN Brunetta Genera, MD      . sodium chloride 0.9 % injection 3 mL  3 mL Intravenous Once PRN [place], MD        REVIEW OF SYSTEMS:    10 Point review of Systems was done is negative except as noted above.  PHYSICAL EXAMINATION: ECOG PERFORMANCE STATUS: 0 . Filed Vitals:   09/28/15 1553  Height: 5\' 11"  (1.803 m)  Weight: 191 lb 1.6 oz (86.682 kg)   Filed Weights   09/28/15 1553  Weight: 191 lb 1.6 oz (86.682 kg)   .Body mass index is 26.66 kg/(m^2).  GENERAL:alert, in no acute distress and comfortable SKIN: skin color, texture, turgor  are normal, no rashes or significant lesions EYES: normal, conjunctiva are pink and non-injected, sclera clear OROPHARYNX:no exudate, no erythema and lips, buccal mucosa, and tongue normal  NECK: supple, no JVD, thyroid normal size, non-tender, without nodularity LYMPH:  no palpable lymphadenopathy in the cervical, axillary or inguinal LUNGS: clear to auscultation with normal respiratory effort HEART: regular rate & rhythm,  no murmurs and no lower extremity edema ABDOMEN: abdomen soft, non-tender, normoactive bowel sounds  Musculoskeletal: no cyanosis of digits and no clubbing  PSYCH: alert & oriented x 3 with fluent speech NEURO: no focal motor/sensory deficits  LABORATORY DATA:  I have reviewed the data as listed  . CBC Latest Ref Rng 09/28/2015 06/21/2015 02/11/2015  WBC 3.9 - 10.3 10e3/uL 6.7 5.7 6.2  Hemoglobin 11.6 - 15.9 g/dL 12.5 11.8 8.2(L)  Hematocrit 34.8 - 46.6 % 37.2 35.4 27.8(L)  Platelets 145 - 400 10e3/uL 291 259 399   CBC    Component Value Date/Time   WBC 6.7 09/28/2015 1531   WBC 6.2 02/11/2015 1002   RBC 4.31 09/28/2015 1531   RBC 4.43 02/11/2015 1002   HGB 12.5 09/28/2015 1531   HGB 8.2* 02/11/2015 1002   HCT 37.2 09/28/2015 1531   HCT 27.8* 02/11/2015 1002   PLT 291 09/28/2015 1531   PLT 399 02/11/2015 1002   MCV 86.3 09/28/2015 1531   MCV 62.8* 02/11/2015 1002   MCH 29.0 09/28/2015 1531   MCH 18.5* 02/11/2015 1002   MCHC 33.6 09/28/2015 1531   MCHC 29.5* 02/11/2015 1002   RDW 12.9 09/28/2015 1531   RDW 19.0* 02/11/2015 1002   LYMPHSABS 2.1 09/28/2015 1531   LYMPHSABS 2.0 02/11/2015 1002   MONOABS 0.6 09/28/2015 1531   MONOABS 0.5 02/11/2015 1002   EOSABS 0.2 09/28/2015 1531   EOSABS 0.3 02/11/2015 1002   BASOSABS 0.1 09/28/2015 1531   BASOSABS 0.1 02/11/2015 1002      . CMP Latest Ref Rng 09/28/2015 06/21/2015 12/31/2014  Glucose 70 - 140 mg/dl 103 95 81  BUN 7.0 - 26.0 mg/dL 14.7 20 14   Creatinine 0.6 - 1.1 mg/dL 0.9 0.79 0.62  Sodium  136 - 145 mEq/L 141 140 138  Potassium 3.5 - 5.1 mEq/L 3.7 4.0 4.3  Chloride 101 - 111 mmol/L - 109 105  CO2 22 - 29 mEq/L 24 25 24   Calcium 8.4 - 10.4 mg/dL 8.9 8.9 8.5  Total Protein 6.4 - 8.3 g/dL 7.3 6.8 6.7  Total Bilirubin 0.20 - 1.20 mg/dL <0.30 0.2(L) 0.4  Alkaline Phos 40 - 150 U/L 78 63 67  AST 5 - 34 U/L 12 18 13   ALT 0 - 55 U/L 9 14 9   . Lab Results  Component Value Date   IRON 35* 06/21/2015  TIBC 349 06/21/2015   IRONPCTSAT 10* 06/21/2015   (Iron and TIBC)  Lab Results  Component Value Date   FERRITIN 7* 06/21/2015      RADIOGRAPHIC STUDIES: I have personally reviewed the radiological images as listed and agreed with the findings in the report. No results found.  ASSESSMENT & PLAN:    40 year old Hispanic female with no medical comorbidities with  #1 chronic symptomatic microcytic anemia due to severe iron deficiency. This is likely due to ongoing iron losses in the form of heavy menstrual periods and likely limited oral intake of iron. She tried oral iron with ferrous sulfate slow release iron which she could not tolerate due to significant GI distress even at low dose. Her hemoglobin is much improved at 11.8 up from her previous hemoglobin of 8.2. MCV has normalized. Her pica symptoms characterized by ice cravings have resolved. Her ferritin level is too low at 7 due to ongoing menorrhagia. Patient was offered additional iron IV in December 2016 chose to try the oral ferrous sulfate again and was able to tolerate it along with food. Her hemoglobin today is much improved at 12.5 with normal MCV.  Iron labs are currently pending.  #2 severe menorrhagia likely due to uterine fibroids. Patient has been dilated by Dr. Toney Rakes and was offered a hysterectomy in March 2017 and has postponed this and rescheduled that for June 2017.  Plan -We will followup on her ferritin levels from today. -She'll followup with Dr. Toney Rakes and have her hysterectomy to  address menorrhagia related to her uterine fibroids. -She was recommended to continue her ferrous sulfate 1 tablet by mouth twice a day with meals and preferably with orange juice. -We would recommend continuing oral iron replacement for at least 6 months after her hysterectomy to build up her iron stores. -She should continue following up with her primary care physician for ongoing oral iron replacement.  She notes that she has to sit up a new primary care physician which I recommended would be important.  Return to clinic with Dr. Irene Limbo on an as-needed basis if any new questions or concerns arise. -Established and follow up with primary care physician.  All of her questions were answered to her apparent satisfaction. The patient knows to call the clinic with any problems, questions or concerns.  I spent 15 minutes counseling the patient face to face. The total time spent in the appointment was 15 minutes and more than 50% was on counseling and direct patient cares.    Sullivan Lone MD [place] AAHIVMS [facility] Hematology/Oncology Physician [facility]  (Office):       [phone] (Work cell):  [phone] (Fax):           [phone]  09/29/2015 12:04 AM

## 2015-10-04 ENCOUNTER — Inpatient Hospital Stay: Admit: 2015-10-04 | Payer: 59 | Admitting: Gynecology

## 2015-10-04 SURGERY — HYSTERECTOMY, ABDOMINAL
Anesthesia: General

## 2015-10-11 ENCOUNTER — Telehealth: Payer: Self-pay

## 2015-10-11 NOTE — Telephone Encounter (Signed)
Patient called inquiring of June surgery schedule is ready. I called her back and left message per DPR access note that it is not ready yet. Dr. Moshe Salisbury will return next week and we will confirm his June schedule with him and I will call her as soon as schedule is available.

## 2015-10-25 ENCOUNTER — Encounter: Payer: Self-pay | Admitting: Gynecology

## 2015-10-25 ENCOUNTER — Telehealth: Payer: Self-pay

## 2015-10-25 NOTE — Telephone Encounter (Signed)
Patient called me back and wanted to schedule for June 27. I scheduled her for surgery 9:00am that morning at [facility].  She was informed. She scheduled her pre op consult. We previously had discussed her ins benefits and estimated financial responsibility and I will be sending her a financial letter by mail.

## 2015-10-25 NOTE — Telephone Encounter (Signed)
I called patient and left message that the June schedule is ready. Available dates are June 6, 20, 27. I asked her to to call me asap.

## 2015-11-30 ENCOUNTER — Telehealth: Payer: Self-pay

## 2015-11-30 ENCOUNTER — Other Ambulatory Visit: Payer: Self-pay | Admitting: Gynecology

## 2015-11-30 NOTE — Telephone Encounter (Signed)
error 

## 2015-11-30 NOTE — Telephone Encounter (Signed)
Patient called regarding medication that she says she needs to have refilled before her menses. I called and left voice mail message to call me back.

## 2015-11-30 NOTE — Telephone Encounter (Signed)
Patient called. She said that she never filled Lysteda Tabs as prescribed in Jan. The pharmacy still has the Rx and will get it ready for her.

## 2015-12-09 DIAGNOSIS — Z0389 Encounter for observation for other suspected diseases and conditions ruled out: Secondary | ICD-10-CM

## 2015-12-13 ENCOUNTER — Encounter: Payer: Self-pay | Admitting: Anesthesiology

## 2015-12-19 NOTE — Patient Instructions (Addendum)
Your procedure is scheduled on:  Tuesday, January 03, 2016  Enter through the Main Entrance of [facility] at:  7:30 AM  Pick up the phone at the desk and dial [phone].  Call this number if you have problems the morning of surgery: [phone].  Remember:  Do NOT eat food or drink after:  Midnight Monday, January 02, 2016  Take these medicines the morning of surgery with a SIP OF WATER:  None  Do NOT wear jewelry (body piercing), metal hair clips/bobby pins, make-up, or nail polish. Do NOT wear lotions, powders, or perfumes.  You may wear deodorant. Do NOT shave for 48 hours prior to surgery. Do NOT bring valuables to the hospital. Contacts, dentures, or bridgework may not be worn into surgery.  Leave suitcase in car.  After surgery it may be brought to your room.  For patients admitted to the hospital, checkout time is 11:00 AM the day of discharge.     Abdominal Hysterectomy  Abdominal hysterectomy is a surgery to remove your womb (uterus). Your womb is the part of your body that contains a growing baby. The surgery may be done for many reasons. These may include cancer, growths (tumors), long-term pain, or bleeding. You may also need other reproductive parts removed during this surgery. This will depend on why you need to have the surgery.  BEFORE THE PROCEDURE  Talk to your doctor about the changes to your body. These changes may be physical and emotional.  You may need to have blood work done. You may also need X-rays done.  Quit smoking if you smoke. Ask your doctor for help.  Stop taking medicines that thin your blood as told by your doctor.  Your doctor may have you take other medicines. Take all medicines as told by your doctor.  Do not eat or drink anything for 6-8 hours before surgery.  Take your normal medicines with a small sip of water.  Shower or take a bath the night AND morning before surgery.  PROCEDURE  This surgery is done in the hospital.  You are  given a medicine that makes you go to sleep (general anesthetic).  The doctor will make a cut (incision) through the skin in your lower belly.  The cut may be about 5-7 inches long. It may go side-to-side or up-and-down.  The doctor will move the body tissue that covers your womb. The doctor will carefully remove your womb. The doctor may remove any other reproductive parts that need to be removed.  The doctor will use clamps or stitches (sutures) to control bleeding.  The doctor will close your cut with stitches or metal clips.  AFTER THE PROCEDURE  You will have pain right after the procedure.  You will be given pain medicine in the recovery room.  You will be taken to your hospital room after the medicines that made you go to sleep wear off.  You will be told how to take care of yourself at home.   This information is not intended to replace advice given to you by your health care provider. Make sure you discuss any questions you have with your health care provider.   Document Released: 06/30/2013 Document Reviewed: 06/30/2013 Elsevier Interactive Patient Education [employer].

## 2015-12-20 ENCOUNTER — Encounter (HOSPITAL_COMMUNITY)
Admission: RE | Admit: 2015-12-20 | Discharge: 2015-12-20 | Disposition: A | Discharge status: Home / Self Care | Payer: 59 | Source: Ambulatory Visit | Attending: Gynecology | Admitting: Gynecology

## 2015-12-20 ENCOUNTER — Encounter (HOSPITAL_COMMUNITY): Payer: Self-pay

## 2015-12-20 DIAGNOSIS — Z01812 Encounter for preprocedural laboratory examination: Secondary | ICD-10-CM | POA: Diagnosis not present

## 2015-12-20 HISTORY — DX: Unspecified asthma, uncomplicated: J45.909

## 2015-12-20 HISTORY — DX: Allergy, unspecified, initial encounter: T78.40XA

## 2015-12-20 LAB — CBC
HEMATOCRIT: 34.8 % — AB (ref 36.0–46.0)
HEMOGLOBIN: 11.3 g/dL — AB (ref 12.0–15.0)
MCH: 26.2 pg (ref 26.0–34.0)
MCHC: 32.5 g/dL (ref 30.0–36.0)
MCV: 80.6 fL (ref 78.0–100.0)
Platelets: 277 10*3/uL (ref 150–400)
RBC: 4.32 MIL/uL (ref 3.87–5.11)
RDW: 14.8 % (ref 11.5–15.5)
WBC: 6.5 10*3/uL (ref 4.0–10.5)

## 2015-12-26 ENCOUNTER — Ambulatory Visit (INDEPENDENT_AMBULATORY_CARE_PROVIDER_SITE_OTHER): Payer: 59 | Admitting: Gynecology

## 2015-12-26 ENCOUNTER — Encounter: Payer: Self-pay | Admitting: Gynecology

## 2015-12-26 VITALS — BP 118/76 | Ht 71.0 in | Wt 191.0 lb

## 2015-12-26 DIAGNOSIS — N920 Excessive and frequent menstruation with regular cycle: Secondary | ICD-10-CM | POA: Diagnosis not present

## 2015-12-26 DIAGNOSIS — D500 Iron deficiency anemia secondary to blood loss (chronic): Secondary | ICD-10-CM

## 2015-12-26 DIAGNOSIS — D251 Intramural leiomyoma of uterus: Secondary | ICD-10-CM

## 2015-12-26 DIAGNOSIS — Z01818 Encounter for other preprocedural examination: Secondary | ICD-10-CM

## 2015-12-26 MED ORDER — OXYCODONE-ACETAMINOPHEN 5-325 MG PO TABS: 1.0000 | ORAL_TABLET | ORAL | Status: DC | PRN

## 2015-12-26 MED ORDER — METOCLOPRAMIDE HCL 10 MG PO TABS: 10.0000 mg | ORAL_TABLET | Freq: Three times a day (TID) | ORAL | Status: DC

## 2015-12-26 NOTE — Progress Notes (Signed)
Denise Howell is an 40 y.o. 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 She has suffer from severe anemia whereby she has had to see the medical oncologist for IV iron infusion. She responded well to 2 doses of Feraheme in October 2016. She is currently taking oral iron twice a day.  Patient had an ultrasound 06/20/2015 which the following was noted: Uterus measured 12.5 x 10.9 x 9.3 cm with endometrial stripe a 28 mm. Several fibroids the largest one measuring 4.3 x 4.0 x 5.3 mm. And a fibroid suggesting possibility of it being submucous location measuring 6.7 x 5.0 x 6.5 mm both right and left ovary were normal no fluid in the cul-de-sac.  Follow-up sonohysterogram January 2017 which demonstrated the following: Uterus measured 13.7 endometrium 35.3 mm. Fibroids unchanged from previous exam. A right ovarian thinwall cyst with internal low level echoes measuring 21 x 17 mm was noted. No fluid in the cul-de-sac. Left ovary normal no fluid in the cul-de-sac. Her cervix was then cleansed with Betadine solution a sterile catheter was introduced into the uterine cavity and a 0.0 x 4.7 x 6.3 cm defect consistent with a large submucous myoma with transdermal pattern was noted encroaching the entire uterine cavity.  She is ready to proceed with total abdominal hysterectomy with bilateral salpingectomy which is scheduled for June 27. Patient's hemoglobin and hematocrit June 13 of this year demonstrated the following: Hemoglobin 11.3, hematocrit 34.8, platelet count 277,000.  Pertinent Gynecological History: Menses: Menometrorrhagia Bleeding: dysfunctional uterine bleeding Contraception: vasectomy DES exposure: unknown Blood transfusions: none Sexually transmitted  diseases: no past history Previous GYN Procedures: 2 NSVD  Last mammogram: not yet Date: not yet Last pap: normal Date: 2015 OB History: G2, P2   Menstrual History: Menarche age: 73 Patient's last menstrual period was 12/09/2015. Period Cycle (Days): 28 Period Duration (Days): 7 Period Pattern: Regular Menstrual Flow: Heavy Dysmenorrhea: (!) Moderate Dysmenorrhea Symptoms: Cramping  Past Medical History  Diagnosis Date  . Anemia   . Menorrhagia   . Asthma   . Allergy     History reviewed. No pertinent past surgical history.  Family History  Problem Relation Age of Onset  . Hypertension Father   . Cancer Father     KIDNEY  . Breast cancer Maternal Grandmother 47  . Heart disease Maternal Grandmother   . Hyperlipidemia Mother     Social History:  reports that she has never smoked. She has never used smokeless tobacco. She reports that she does not drink alcohol or use illicit drugs.  Allergies: No Known Allergies   (Not in a hospital admission)  REVIEW OF SYSTEMS: A ROS was performed and pertinent positives and negatives are included in the history.  GENERAL: No fevers or chills. HEENT: No change in vision, no earache, sore throat or sinus congestion. NECK: No pain or stiffness. CARDIOVASCULAR: No chest pain or pressure. No palpitations. PULMONARY: No shortness of breath, cough or wheeze. GASTROINTESTINAL: No abdominal pain, nausea, vomiting or diarrhea, melena or bright red blood per rectum. GENITOURINARY: No urinary frequency, urgency, hesitancy or dysuria. MUSCULOSKELETAL: No joint or muscle pain, no back pain, no recent trauma. DERMATOLOGIC: No rash, no itching, no lesions. ENDOCRINE: No polyuria, polydipsia, no heat or cold intolerance. No recent change in weight. HEMATOLOGICAL:  No anemia or easy bruising or bleeding. NEUROLOGIC: No headache, seizures, numbness, tingling or weakness. PSYCHIATRIC: No depression, no loss of interest in normal activity or change in sleep  pattern.     Blood pressure 118/76, height 5\' 11"  (1.803 m), weight 191 lb (86.637 kg), last menstrual period 12/09/2015.  Physical Exam:  HEENT:unremarkable Neck:Supple, midline, no thyroid megaly, no carotid bruits Lungs:  Clear to auscultation no rhonchi's or wheezes Heart:Regular rate and rhythm, no murmurs or gallops Breast Exam: Symmetrical in appearance no palpable mass or tenderness no supra clavicular axillary lymphadenopathy Abdomen: Soft nontender no rebound or guarding Pelvic:BUS within normal limits Vagina: No lesions or discharge Cervix: No lesions or discharge Uterus: Approximate 14 week size and firm Adnexa: No palpable masses or tenderness although limited due to size of uterus Extremities: No cords, no edema Rectal: Not performed  No results found for this or any previous visit (from the past 24 hour(s)).  No results found.  Assessment/Plan: 40 year old gravida 2 para 2 with symptomatic leiomyomatous uteri scheduled to undergo total abdominal hysterectomy with bilateral salpingectomy. She was provided with prescription for Percocet 5/325 take 1 by mouth every 4-6 hours when necessary postop. Also prescription for Reglan 10 mg take 1 by mouth every 6 hours when necessary nausea or vomiting postop. Risks for her surgery were discussed and outlined in Spanish as follows:                        Patient was counseled as to the risk of surgery to include the following:  1. Infection (prohylactic antibiotics will be administered)  2. DVT/Pulmonary Embolism (prophylactic pneumo compression stockings will be used)  3.Trauma to internal organs requiring additional surgical procedure to repair any injury to     Internal organs requiring perhaps additional hospitalization days.  4.Hemmorhage requiring transfusion and blood products which carry risks such as   anaphylactic reaction, hepatitis and AIDS  Patient had received literature information on the procedure scheduled  and all her questions were answered and fully accepts all risk.   [facility] HMD4:37 PMTD     Terrance Mass 12/26/2015, 3:59 PM

## 2015-12-26 NOTE — Patient Instructions (Signed)
Histerectoma abdominal (Abdominal Hysterectomy) La histerectoma abdominal es un procedimiento quirrgico en el que se extirpa el tero. El tero es el rgano muscular donde se desarrolla el feto. Esta ciruga puede hacerse por muchos motivos. Puede necesitar una histerectoma abdominal si tiene cncer, tumores, dolor a largo plazo o hemorragia. Tambin pueden hacerle este procedimiento si el tero ha descendido hacia la vagina (prolapso uterino). Segn las causas por las que necesite una histerectoma abdominal, es posible que tambin le extirpen otros rganos del aparato reproductor. Estos podran incluir la parte de la vagina que se conecta con el tero (cuello del tero), los rganos que producen vulos (ovarios) y las trompas que conectan los ovarios con el tero (trompas de Falopio). INFORME AL MDICO:   Cualquier alergia que tenga.  Todos los medicamentos que utiliza, incluidos vitaminas, hierbas, gotas oftlmicas, cremas y medicamentos de venta libre.  Problemas previos que usted o los miembros de su familia hayan tenido con el uso de anestsicos.  Enfermedades de la sangre que tenga.  Cirugas previas.  Enfermedades que tenga. RIESGOS Y COMPLICACIONES En general, se trata de un procedimiento seguro. Sin embargo, como en cualquier procedimiento, pueden surgir problemas. La infeccin es el problema ms frecuente despus de una histerectoma abdominal. Otros problemas posibles incluyen:  Hemorragias.  Formacin de cogulos sanguneos que pueden desprenderse y llegar a los pulmones.  Lesin en otros rganos cercanos al tero.  Lesin en los nervios que causa neuralgia.  Menor inters sexual o dolor durante las relaciones sexuales. ANTES DEL PROCEDIMIENTO  La histerectoma abdominal es un procedimiento quirrgico mayor. Puede afectar la percepcin que tiene de usted misma. Hable con el mdico sobre los cambios fsicos y emocionales que puede causar la histerectoma.  Quiz deban  hacerle anlisis de sangre y radiografas antes de la ciruga.  Si fuma, deje de hacerlo. Pdale ayuda al mdico si est teniendo inconvenientes para dejar de fumar.  Deje de tomar medicamentos anticoagulantes segn las indicaciones del mdico.  Pueden indicarle que tome antibiticos o laxantes antes de la ciruga.  No coma ni beba nada durante las 6 a 8 horas previas a la ciruga.  Tome sus medicamentos habituales con un sorbito de agua.  Tome un bao de inmersin o una ducha la noche o la maana anterior al procedimiento. PROCEDIMIENTO  La histerectoma abdominal se hace en el quirfano del hospital.  En la mayora de los casos, se le administrar un medicamento que la har dormir (anestesia general).  El cirujano har un corte (incisin) a travs de la piel en la parte inferior del abdomen.  La incisin puede tener de 5a 7pulgadas de largo. Puede ser horizontal o vertical.  El cirujano apartar el tejido que recubre al tero. Luego, extraer con cuidado el tero junto con cualquier otro rgano del aparato reproductor que necesite extirpar.  La hemorragia se controlar con pinzas o suturas.  El cirujano cerrar la incisin con suturas o clips metlicos. DESPUS DEL PROCEDIMIENTO  Sentir algo de dolor inmediatamente despus del procedimiento.  Le administrarn medicamentos para calmar el dolor cuando est en el rea de recuperacin.  La llevarn a la habitacin del hospital cuando se haya recuperado de la anestesia.  Es posible que deba permanecer en el hospital durante 2a 5das.  Recibir instrucciones para recuperarse en su casa.   Esta informacin no tiene como fin reemplazar el consejo del mdico. Asegrese de hacerle al mdico cualquier pregunta que tenga.   Document Released: 06/30/2013 Elsevier Interactive Patient Education 2016 Elsevier Inc.  

## 2016-01-02 MED ORDER — DEXTROSE 5 % IV SOLN
2.0000 g | INTRAVENOUS | Status: AC
  Administered 2016-01-03: 2 g via INTRAVENOUS
  Filled 2016-01-02: qty 2

## 2016-01-03 ENCOUNTER — Inpatient Hospital Stay (HOSPITAL_COMMUNITY): Payer: 59 | Admitting: Anesthesiology

## 2016-01-03 ENCOUNTER — Encounter (HOSPITAL_COMMUNITY)
Admission: RE | Disposition: A | Discharge status: Home / Self Care | Payer: Self-pay | Source: Ambulatory Visit | Attending: Gynecology

## 2016-01-03 ENCOUNTER — Observation Stay (HOSPITAL_COMMUNITY)
Admission: RE | Admit: 2016-01-03 | Discharge: 2016-01-04 | Disposition: A | Discharge status: Home / Self Care | Payer: 59 | Source: Ambulatory Visit | Attending: Gynecology | Admitting: Gynecology

## 2016-01-03 ENCOUNTER — Encounter (HOSPITAL_COMMUNITY): Payer: Self-pay | Admitting: *Deleted

## 2016-01-03 DIAGNOSIS — D251 Intramural leiomyoma of uterus: Principal | ICD-10-CM | POA: Insufficient documentation

## 2016-01-03 DIAGNOSIS — N946 Dysmenorrhea, unspecified: Secondary | ICD-10-CM | POA: Diagnosis not present

## 2016-01-03 DIAGNOSIS — N838 Other noninflammatory disorders of ovary, fallopian tube and broad ligament: Secondary | ICD-10-CM | POA: Diagnosis not present

## 2016-01-03 DIAGNOSIS — D500 Iron deficiency anemia secondary to blood loss (chronic): Secondary | ICD-10-CM

## 2016-01-03 DIAGNOSIS — D649 Anemia, unspecified: Secondary | ICD-10-CM | POA: Diagnosis not present

## 2016-01-03 DIAGNOSIS — N841 Polyp of cervix uteri: Secondary | ICD-10-CM | POA: Insufficient documentation

## 2016-01-03 DIAGNOSIS — N938 Other specified abnormal uterine and vaginal bleeding: Secondary | ICD-10-CM | POA: Insufficient documentation

## 2016-01-03 DIAGNOSIS — N921 Excessive and frequent menstruation with irregular cycle: Secondary | ICD-10-CM | POA: Diagnosis not present

## 2016-01-03 DIAGNOSIS — D259 Leiomyoma of uterus, unspecified: Secondary | ICD-10-CM | POA: Diagnosis not present

## 2016-01-03 DIAGNOSIS — J45909 Unspecified asthma, uncomplicated: Secondary | ICD-10-CM | POA: Insufficient documentation

## 2016-01-03 DIAGNOSIS — R102 Pelvic and perineal pain: Secondary | ICD-10-CM

## 2016-01-03 DIAGNOSIS — N72 Inflammatory disease of cervix uteri: Secondary | ICD-10-CM | POA: Diagnosis not present

## 2016-01-03 DIAGNOSIS — Z9889 Other specified postprocedural states: Secondary | ICD-10-CM

## 2016-01-03 DIAGNOSIS — N920 Excessive and frequent menstruation with regular cycle: Secondary | ICD-10-CM | POA: Diagnosis not present

## 2016-01-03 HISTORY — PX: HYSTERECTOMY ABDOMINAL WITH SALPINGECTOMY: SHX6725

## 2016-01-03 LAB — PREGNANCY, URINE: PREG TEST UR: NEGATIVE

## 2016-01-03 SURGERY — HYSTERECTOMY, TOTAL, ABDOMINAL, WITH SALPINGECTOMY
Anesthesia: General | Laterality: Bilateral

## 2016-01-03 MED ORDER — HYDROMORPHONE HCL 1 MG/ML IJ SOLN
INTRAMUSCULAR | Status: AC
  Filled 2016-01-03: qty 1

## 2016-01-03 MED ORDER — ONDANSETRON HCL 4 MG/2ML IJ SOLN: 4.0000 mg | Freq: Four times a day (QID) | INTRAMUSCULAR | Status: DC | PRN

## 2016-01-03 MED ORDER — SODIUM CHLORIDE 0.9% FLUSH: 9.0000 mL | INTRAVENOUS | Status: DC | PRN

## 2016-01-03 MED ORDER — VASOPRESSIN 20 UNIT/ML IV SOLN
INTRAVENOUS | Status: AC
  Filled 2016-01-03: qty 1

## 2016-01-03 MED ORDER — LIDOCAINE HCL (CARDIAC) 20 MG/ML IV SOLN
INTRAVENOUS | Status: AC
  Filled 2016-01-03: qty 5

## 2016-01-03 MED ORDER — SODIUM CHLORIDE 0.9 % IJ SOLN
INTRAMUSCULAR | Status: AC
  Filled 2016-01-03: qty 100

## 2016-01-03 MED ORDER — SCOPOLAMINE 1 MG/3DAYS TD PT72
MEDICATED_PATCH | TRANSDERMAL | Status: AC
  Filled 2016-01-03: qty 1

## 2016-01-03 MED ORDER — SCOPOLAMINE 1 MG/3DAYS TD PT72
1.0000 | MEDICATED_PATCH | Freq: Once | TRANSDERMAL | Status: DC
  Administered 2016-01-03: 1.5 mg via TRANSDERMAL

## 2016-01-03 MED ORDER — ROCURONIUM BROMIDE 100 MG/10ML IV SOLN
INTRAVENOUS | Status: DC | PRN
  Administered 2016-01-03 (×2): 10 mg via INTRAVENOUS
  Administered 2016-01-03: 50 mg via INTRAVENOUS

## 2016-01-03 MED ORDER — ONDANSETRON HCL 4 MG/2ML IJ SOLN
INTRAMUSCULAR | Status: AC
  Filled 2016-01-03: qty 2

## 2016-01-03 MED ORDER — GLYCOPYRROLATE 0.2 MG/ML IJ SOLN
INTRAMUSCULAR | Status: DC | PRN
Start: 2016-01-03 — End: 2016-01-03
  Administered 2016-01-03: 0.1 mg via INTRAVENOUS

## 2016-01-03 MED ORDER — DIPHENHYDRAMINE HCL 12.5 MG/5ML PO ELIX: 12.5000 mg | ORAL_SOLUTION | Freq: Four times a day (QID) | ORAL | Status: DC | PRN

## 2016-01-03 MED ORDER — LACTATED RINGERS IV SOLN: INTRAVENOUS | Status: DC

## 2016-01-03 MED ORDER — SODIUM CHLORIDE 0.9 % IJ SOLN
INTRAMUSCULAR | Status: DC | PRN
  Administered 2016-01-03: 50 mL via INTRAVENOUS

## 2016-01-03 MED ORDER — PROPOFOL 10 MG/ML IV BOLUS
INTRAVENOUS | Status: AC
  Filled 2016-01-03: qty 20

## 2016-01-03 MED ORDER — FENTANYL CITRATE (PF) 100 MCG/2ML IJ SOLN
INTRAMUSCULAR | Status: DC | PRN
  Administered 2016-01-03: 50 ug via INTRAVENOUS
  Administered 2016-01-03 (×2): 100 ug via INTRAVENOUS

## 2016-01-03 MED ORDER — LIDOCAINE HCL (CARDIAC) 20 MG/ML IV SOLN
INTRAVENOUS | Status: DC | PRN
Start: 2016-01-03 — End: 2016-01-03
  Administered 2016-01-03: 100 mg via INTRAVENOUS

## 2016-01-03 MED ORDER — SUGAMMADEX SODIUM 200 MG/2ML IV SOLN
INTRAVENOUS | Status: DC | PRN
  Administered 2016-01-03: 200 mg via INTRAVENOUS

## 2016-01-03 MED ORDER — BUPIVACAINE LIPOSOME 1.3 % IJ SUSP
20.0000 mL | Freq: Once | INTRAMUSCULAR | Status: DC
  Filled 2016-01-03: qty 20

## 2016-01-03 MED ORDER — MIDAZOLAM HCL 2 MG/2ML IJ SOLN
INTRAMUSCULAR | Status: AC
  Filled 2016-01-03: qty 2

## 2016-01-03 MED ORDER — DIPHENHYDRAMINE HCL 50 MG/ML IJ SOLN: 12.5000 mg | Freq: Four times a day (QID) | INTRAMUSCULAR | Status: DC | PRN

## 2016-01-03 MED ORDER — LACTATED RINGERS IV SOLN
INTRAVENOUS | Status: DC
  Administered 2016-01-03 (×2): via INTRAVENOUS

## 2016-01-03 MED ORDER — DEXAMETHASONE SODIUM PHOSPHATE 4 MG/ML IJ SOLN
INTRAMUSCULAR | Status: AC
  Filled 2016-01-03: qty 1

## 2016-01-03 MED ORDER — GLYCOPYRROLATE 0.2 MG/ML IJ SOLN
INTRAMUSCULAR | Status: AC
  Filled 2016-01-03: qty 1

## 2016-01-03 MED ORDER — FENTANYL CITRATE (PF) 250 MCG/5ML IJ SOLN
INTRAMUSCULAR | Status: AC
  Filled 2016-01-03: qty 5

## 2016-01-03 MED ORDER — DEXAMETHASONE SODIUM PHOSPHATE 10 MG/ML IJ SOLN
INTRAMUSCULAR | Status: DC | PRN
  Administered 2016-01-03: 4 mg via INTRAVENOUS

## 2016-01-03 MED ORDER — NALOXONE HCL 0.4 MG/ML IJ SOLN: 0.4000 mg | INTRAMUSCULAR | Status: DC | PRN

## 2016-01-03 MED ORDER — ONDANSETRON HCL 4 MG/2ML IJ SOLN
INTRAMUSCULAR | Status: DC | PRN
  Administered 2016-01-03: 4 mg via INTRAVENOUS

## 2016-01-03 MED ORDER — ALBUTEROL SULFATE (2.5 MG/3ML) 0.083% IN NEBU: 3.0000 mL | INHALATION_SOLUTION | Freq: Four times a day (QID) | RESPIRATORY_TRACT | Status: DC | PRN

## 2016-01-03 MED ORDER — SODIUM CHLORIDE 0.9 % IV SOLN: 20.0000 mL | Freq: Once | INTRAVENOUS | Status: DC

## 2016-01-03 MED ORDER — LACTATED RINGERS IV SOLN
INTRAVENOUS | Status: DC
  Administered 2016-01-03 (×2): via INTRAVENOUS

## 2016-01-03 MED ORDER — ROCURONIUM BROMIDE 100 MG/10ML IV SOLN
INTRAVENOUS | Status: AC
  Filled 2016-01-03: qty 1

## 2016-01-03 MED ORDER — MORPHINE SULFATE 2 MG/ML IV SOLN
INTRAVENOUS | Status: DC
  Administered 2016-01-03: 0.5 mL via INTRAVENOUS
  Administered 2016-01-03: 13:00:00 via INTRAVENOUS
  Administered 2016-01-03: 3 mL via INTRAVENOUS
  Administered 2016-01-03: 9 mg via INTRAVENOUS
  Administered 2016-01-04: 0 mg via INTRAVENOUS
  Administered 2016-01-04: 6 mg via INTRAVENOUS
  Filled 2016-01-03: qty 25

## 2016-01-03 MED ORDER — BUPIVACAINE LIPOSOME 1.3 % IJ SUSP
INTRAMUSCULAR | Status: DC | PRN
  Administered 2016-01-03: 20 mL

## 2016-01-03 MED ORDER — HYDROMORPHONE HCL 1 MG/ML IJ SOLN
INTRAMUSCULAR | Status: DC | PRN
  Administered 2016-01-03: 1 mg via INTRAVENOUS

## 2016-01-03 MED ORDER — HYDROMORPHONE HCL 1 MG/ML IJ SOLN: 0.2500 mg | INTRAMUSCULAR | Status: DC | PRN

## 2016-01-03 MED ORDER — BUPIVACAINE HCL (PF) 0.25 % IJ SOLN
INTRAMUSCULAR | Status: AC
  Filled 2016-01-03: qty 30

## 2016-01-03 MED ORDER — KETOROLAC TROMETHAMINE 30 MG/ML IJ SOLN
INTRAMUSCULAR | Status: DC | PRN
  Administered 2016-01-03: 30 mg via INTRAVENOUS

## 2016-01-03 MED ORDER — SUGAMMADEX SODIUM 200 MG/2ML IV SOLN
INTRAVENOUS | Status: AC
Start: 2016-01-03 — End: 2016-01-03
  Filled 2016-01-03: qty 2

## 2016-01-03 MED ORDER — MIDAZOLAM HCL 2 MG/2ML IJ SOLN
INTRAMUSCULAR | Status: DC | PRN
  Administered 2016-01-03: 2 mg via INTRAVENOUS

## 2016-01-03 MED ORDER — BUPIVACAINE HCL (PF) 0.25 % IJ SOLN
INTRAMUSCULAR | Status: DC | PRN
  Administered 2016-01-03: 30 mL

## 2016-01-03 MED ORDER — PROPOFOL 10 MG/ML IV BOLUS
INTRAVENOUS | Status: DC | PRN
  Administered 2016-01-03: 200 mg via INTRAVENOUS

## 2016-01-03 SURGICAL SUPPLY — 54 items
BARRIER ADHS 3X4 INTERCEED (GAUZE/BANDAGES/DRESSINGS) IMPLANT
BENZOIN TINCTURE PRP APPL 2/3 (GAUZE/BANDAGES/DRESSINGS) ×3 IMPLANT
CANISTER SUCT 3000ML (MISCELLANEOUS) ×3 IMPLANT
CLOSURE WOUND 1/2 X4 (GAUZE/BANDAGES/DRESSINGS) ×1
CLOTH BEACON ORANGE TIMEOUT ST (SAFETY) ×3 IMPLANT
COVER LIGHT HANDLE  1/PK (MISCELLANEOUS) ×2
COVER LIGHT HANDLE 1/PK (MISCELLANEOUS) ×1 IMPLANT
DECANTER SPIKE VIAL GLASS SM (MISCELLANEOUS) ×12 IMPLANT
DRAPE CESAREAN BIRTH W POUCH (DRAPES) ×3 IMPLANT
DRAPE WARM FLUID 44X44 (DRAPE) IMPLANT
DRSG OPSITE POSTOP 4X10 (GAUZE/BANDAGES/DRESSINGS) ×6 IMPLANT
DRSG XEROFORM 1X8 (GAUZE/BANDAGES/DRESSINGS) ×3 IMPLANT
DURAPREP 26ML APPLICATOR (WOUND CARE) ×6 IMPLANT
GAUZE SPONGE 4X4 16PLY XRAY LF (GAUZE/BANDAGES/DRESSINGS) IMPLANT
GLOVE BIO SURGEON STRL SZ7.5 (GLOVE) ×9 IMPLANT
GLOVE BIOGEL PI IND STRL 7.0 (GLOVE) ×3 IMPLANT
GLOVE BIOGEL PI IND STRL 8 (GLOVE) ×1 IMPLANT
GLOVE BIOGEL PI INDICATOR 7.0 (GLOVE) ×6
GLOVE BIOGEL PI INDICATOR 8 (GLOVE) ×2
GLOVE ECLIPSE 7.5 STRL STRAW (GLOVE) ×6 IMPLANT
GOWN STRL REUS W/TWL LRG LVL3 (GOWN DISPOSABLE) ×9 IMPLANT
HEMOSTAT ARISTA ABSORB 1G (Miscellaneous) ×3 IMPLANT
HEMOSTAT ARISTA ABSORB 3G PWDR (MISCELLANEOUS) ×3 IMPLANT
NEEDLE HYPO 22GX1.5 SAFETY (NEEDLE) IMPLANT
NS IRRIG 1000ML POUR BTL (IV SOLUTION) ×3 IMPLANT
PACK ABDOMINAL GYN (CUSTOM PROCEDURE TRAY) ×3 IMPLANT
PAD OB MATERNITY 4.3X12.25 (PERSONAL CARE ITEMS) ×3 IMPLANT
PENCIL SMOKE EVAC W/HOLSTER (ELECTROSURGICAL) ×3 IMPLANT
PROTECTOR NERVE ULNAR (MISCELLANEOUS) ×6 IMPLANT
RETAINER VISCERAL (MISCELLANEOUS) ×3 IMPLANT
RETRACTOR WND ALEXIS 25 LRG (MISCELLANEOUS) IMPLANT
RTRCTR WOUND ALEXIS 25CM LRG (MISCELLANEOUS)
SPONGE GAUZE 4X4 12PLY STER LF (GAUZE/BANDAGES/DRESSINGS) ×6 IMPLANT
SPONGE LAP 18X18 X RAY DECT (DISPOSABLE) ×6 IMPLANT
STAPLER VISISTAT 35W (STAPLE) ×3 IMPLANT
STRIP CLOSURE SKIN 1/2X4 (GAUZE/BANDAGES/DRESSINGS) ×2 IMPLANT
SUT CHROMIC 3 0 SH 27 (SUTURE) IMPLANT
SUT VIC AB 0 CT1 18XCR BRD8 (SUTURE) ×3 IMPLANT
SUT VIC AB 0 CT1 36 (SUTURE) ×6 IMPLANT
SUT VIC AB 0 CT1 8-18 (SUTURE) ×6
SUT VIC AB 1 CT1 18XBRD ANBCTR (SUTURE) IMPLANT
SUT VIC AB 1 CT1 8-18 (SUTURE)
SUT VIC AB 3-0 CT1 27 (SUTURE) ×4
SUT VIC AB 3-0 CT1 TAPERPNT 27 (SUTURE) ×2 IMPLANT
SUT VIC AB 3-0 SH 27 (SUTURE) ×2
SUT VIC AB 3-0 SH 27X BRD (SUTURE) ×1 IMPLANT
SUT VIC AB 4-0 KS 27 (SUTURE) ×3 IMPLANT
SUT VICRYL 0 TIES 12 18 (SUTURE) ×3 IMPLANT
SUT VICRYL 3 0 BR 18  UND (SUTURE)
SUT VICRYL 3 0 BR 18 UND (SUTURE) IMPLANT
SYR CONTROL 10ML LL (SYRINGE) IMPLANT
TOWEL OR 17X24 6PK STRL BLUE (TOWEL DISPOSABLE) ×6 IMPLANT
TRAY FOLEY CATH SILVER 14FR (SET/KITS/TRAYS/PACK) ×6 IMPLANT
WATER STERILE IRR 1000ML POUR (IV SOLUTION) ×3 IMPLANT

## 2016-01-03 NOTE — Transfer of Care (Signed)
Immediate Anesthesia Transfer of Care Note  Patient: Denise Howell  Procedure(s) Performed: Procedure(s): HYSTERECTOMY ABDOMINAL WITH BILATERAL SALPINGECTOMY (Bilateral)  Patient Location: PACU  Anesthesia Type:General  Level of Consciousness: awake, alert  and oriented  Airway & Oxygen Therapy: Patient Spontanous Breathing and Patient connected to nasal cannula oxygen  Post-op Assessment: Report given to RN and Post -op Vital signs reviewed and stable  Post vital signs: Reviewed and stable  Last Vitals:  Filed Vitals:   01/03/16 0745  BP: 121/78  Pulse: 84  Temp: 36.9 C  Resp: 18    Last Pain: There were no vitals filed for this visit.    Patients Stated Pain Goal: 3 ([id number])  Complications: No apparent anesthesia complications

## 2016-01-03 NOTE — Op Note (Signed)
Operative Note  01/03/2016  11:14 AM  PATIENT:  Denise Howell  40 y.o. female  PRE-OPERATIVE DIAGNOSIS:  fibroid uterus, chronic anemia  POST-OPERATIVE DIAGNOSIS:  fibroid uterus, chronic anemia  PROCEDURE:  Procedure(s): HYSTERECTOMY ABDOMINAL WITH BILATERAL SALPINGECTOMY  SURGEON:  Surgeon(s): Terrance Mass, MD Anastasio Auerbach, MD  ANESTHESIA:   general  FINDINGS:globular 14 week size uterus large intramural myoma encroaching uterine cavity. Normal fallopian tubes and ovaries bilateral  DESCRIPTION OF OPERATION:The patient was taken to the operating room and was placed in a dorsal supine position. She was then placed under general anesthesia and intubated. A time out was undertaken to correctly identify the patient and procedure to be undertaken. She was then examined under anesthesia with notation of a 14-16 week sized uterus.The procedure was begun by performing a Pfannenstiel skin Incision with the first knife. The incision was extended to the level of the fascia using Bovie coagulation. The fascia was scored bilaterally at the midline. The fascial incision was then extended in a curvilinear fashion using the Mayo scissors. The fascia was dissected from the muscular area below by both sharp and blunt dissection. The muscular area was dissected along the midline by both sharp and blunt dissection. The peritoneum was subsequently entered. The incision was then extended in a vertical fashion using Metzenbaum scissors. Immediate notation was made of a large uterine fibroid the uterus was then delivered through the incision. The right And left ovary were within normal limits.A self-retaining O'Connor-O'Sullivan retractor was placed into the abdomen. The abdominal contents were packed in the upper abdomen using 2 clean wet laps. Two large Kelly clamps were placed above the cornual portion of the uterus, and the hysterectomy was begun by transecting the round ligaments, which were  subsequently suture ligated with 0 Vicryl suture and tag. Uterovesical peritoneum was subsequently incised with the Metzenbaum scissors, and incision was extended to the sidewall on both side allowing dissection to the sidewall and subsequent visualization of the uterus. Once the uterus was felt free, the infundibulopelvic ligaments were then grasped with 2 curved Haney clamps, subsequently cut and the pedicle was initially free tied with 0 Vicryl and subsequently suture ligated with 0 Vicryl suture leaving both tubes and ovaries behind. Attention was then turned to the round ligament, which was also secured in the previous fashion. The uterine vessels were subsequently skeletonized, subsequently grasped with curved Heaney clamps, cut and suture ligated with 0 Vicryl suture. Cardinal ligaments on either side were subsequently grasped, cut and suture ligated with 0 Vicryl suture to the level of the uterosacral ligaments, which were then grasped with curved Haney clamps, cut and suture ligated with 0 Vicryl suture. These sutures were left long and tagged with hemostats. Subsequently, 2 large clamps were placed about the upper vaginal cuff. The specimen was subsequently dissected free with the Jorgenson scissors, and the pedicles were suture ligated with 0 Vicryl suture. The vaginal cuff was also closed with 0 Vicryl suture in an interrupted fashion. Subsequently, the pelvis was irrigated until clear. Points of bleeding were secured either by Bovie coagulation or by suture ligature with a 0 Vicryl suture. Attention was then placed on the left fallopian tube. With a Kelly clamp placed at the mesosalpinx the left fallopian tube was excised and passed off the operative field for histological evaluation. The pedicle was secured with a locking stitch of 0 Vicryl suture. Similar procedure was carried out on the contralateral side.Once hemostasis was [place] hemostatic agent was applied into the  raw surfaces of the  peritoneum and vaginal cuff. The O'Connor-O'Sullivan retractor was subsequently removed. Laps were subsequently removed and the abdominal wall was closed in the following fashion. The fascial layers were approximated with 0 Vicryl with sutures coming from the closing angles interlocking every third towards the midline. The subcutaneous layer was reapproximated with 3-0 Vicryl suture. The skin edges were reapproximated with 4-0 Vicryl suture subcutaneously with a Lanny Hurst needle. Steri-Strips were then placed on the incision.. For postoperative analgesia Exparel 1.3% was infiltrated at the level of the peritoneum, fascia and subcutaneous for a total of 60 cc.The patient tolerated the procedure well. The uterus and cervix were passed off the operative field for histological evaluation. The patient was extubated and transferred to recovery room stable vital signs. She received Toradol 30 mg IV in route to the recovery room.     ESTIMATED BLOOD LOSS: 150 cc   Intake/Output Summary (Last 24 hours) at 01/03/16 1114 Last data filed at 01/03/16 1055  Gross per 24 hour  Intake   1000 ml  Output    250 ml  Net    750 ml     BLOOD ADMINISTERED:none   LOCAL MEDICATIONS USED:  OTHER Exparel   SPECIMEN:  Source of Specimen:  Uterus, cervix, bilateral fallopian tubes  DISPOSITION OF SPECIMEN:  PATHOLOGY  COUNTS:  YES  PLAN OF CARE: Transfer to PACU  [facility] HMD11:14 AMTD@

## 2016-01-03 NOTE — Anesthesia Procedure Notes (Signed)
Procedure Name: Intubation Performed by: Jonna Munro Pre-anesthesia Checklist: Patient identified, Emergency Drugs available, Suction available, Timeout performed and Patient being monitored Patient Re-evaluated:Patient Re-evaluated prior to inductionOxygen Delivery Method: Circle system utilized Preoxygenation: Pre-oxygenation with 100% oxygen Intubation Type: IV induction Ventilation: Mask ventilation without difficulty Laryngoscope Size: Mac and 3 Grade View: Grade I Tube type: Oral Tube size: 7.0 mm Number of attempts: 1 Airway Equipment and Method: Stylet Placement Confirmation: ETT inserted through vocal cords under direct vision,  positive ETCO2 and breath sounds checked- equal and bilateral Secured at: 22 cm Tube secured with: Tape Dental Injury: Teeth and Oropharynx as per pre-operative assessment

## 2016-01-03 NOTE — Anesthesia Postprocedure Evaluation (Signed)
Anesthesia Post Note  Patient: Actuary  Procedure(s) Performed: Procedure(s) (LRB): HYSTERECTOMY ABDOMINAL WITH BILATERAL SALPINGECTOMY (Bilateral)  Patient location during evaluation: PACU Anesthesia Type: General Level of consciousness: awake and alert Pain management: pain level controlled Vital Signs Assessment: post-procedure vital signs reviewed and stable Respiratory status: spontaneous breathing, nonlabored ventilation, respiratory function stable and patient connected to nasal cannula oxygen Cardiovascular status: blood pressure returned to baseline and stable Postop Assessment: no signs of nausea or vomiting Anesthetic complications: no     Last Vitals:  Filed Vitals:   01/03/16 1145 01/03/16 1200  BP: 120/80 122/78  Pulse: 74 71  Temp:    Resp: 16 20    Last Pain:  Filed Vitals:   01/03/16 1204  PainSc: 0-No pain   Pain Goal: Patients Stated Pain Goal: 3 (01/03/16 1200)               Deepika Decatur L

## 2016-01-03 NOTE — H&P (View-Only) (Signed)
Denise Howell is an 40 y.o. 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 She has suffer from severe anemia whereby she has had to see the medical oncologist for IV iron infusion. She responded well to 2 doses of Feraheme in October 2016. She is currently taking oral iron twice a day.  Patient had an ultrasound 06/20/2015 which the following was noted: Uterus measured 12.5 x 10.9 x 9.3 cm with endometrial stripe a 28 mm. Several fibroids the largest one measuring 4.3 x 4.0 x 5.3 mm. And a fibroid suggesting possibility of it being submucous location measuring 6.7 x 5.0 x 6.5 mm both right and left ovary were normal no fluid in the cul-de-sac.  Follow-up sonohysterogram January 2017 which demonstrated the following: Uterus measured 13.7 endometrium 35.3 mm. Fibroids unchanged from previous exam. A right ovarian thinwall cyst with internal low level echoes measuring 21 x 17 mm was noted. No fluid in the cul-de-sac. Left ovary normal no fluid in the cul-de-sac. Her cervix was then cleansed with Betadine solution a sterile catheter was introduced into the uterine cavity and a 0.0 x 4.7 x 6.3 cm defect consistent with a large submucous myoma with transdermal pattern was noted encroaching the entire uterine cavity.  She is ready to proceed with total abdominal hysterectomy with bilateral salpingectomy which is scheduled for June 27. Patient's hemoglobin and hematocrit June 13 of this year demonstrated the following: Hemoglobin 11.3, hematocrit 34.8, platelet count 277,000.  Pertinent Gynecological History: Menses: Menometrorrhagia Bleeding: dysfunctional uterine bleeding Contraception: vasectomy DES exposure: unknown Blood transfusions: none Sexually transmitted  diseases: no past history Previous GYN Procedures: 2 NSVD  Last mammogram: not yet Date: not yet Last pap: normal Date: 2015 OB History: G2, P2   Menstrual History: Menarche age: 47 Patient's last menstrual period was 12/09/2015. Period Cycle (Days): 28 Period Duration (Days): 7 Period Pattern: Regular Menstrual Flow: Heavy Dysmenorrhea: (!) Moderate Dysmenorrhea Symptoms: Cramping  Past Medical History  Diagnosis Date  . Anemia   . Menorrhagia   . Asthma   . Allergy     History reviewed. No pertinent past surgical history.  Family History  Problem Relation Age of Onset  . Hypertension Father   . Cancer Father     KIDNEY  . Breast cancer Maternal Grandmother 84  . Heart disease Maternal Grandmother   . Hyperlipidemia Mother     Social History:  reports that she has never smoked. She has never used smokeless tobacco. She reports that she does not drink alcohol or use illicit drugs.  Allergies: No Known Allergies   (Not in a hospital admission)  REVIEW OF SYSTEMS: A ROS was performed and pertinent positives and negatives are included in the history.  GENERAL: No fevers or chills. HEENT: No change in vision, no earache, sore throat or sinus congestion. NECK: No pain or stiffness. CARDIOVASCULAR: No chest pain or pressure. No palpitations. PULMONARY: No shortness of breath, cough or wheeze. GASTROINTESTINAL: No abdominal pain, nausea, vomiting or diarrhea, melena or bright red blood per rectum. GENITOURINARY: No urinary frequency, urgency, hesitancy or dysuria. MUSCULOSKELETAL: No joint or muscle pain, no back pain, no recent trauma. DERMATOLOGIC: No rash, no itching, no lesions. ENDOCRINE: No polyuria, polydipsia, no heat or cold intolerance. No recent change in weight. HEMATOLOGICAL:  No anemia or easy bruising or bleeding. NEUROLOGIC: No headache, seizures, numbness, tingling or weakness. PSYCHIATRIC: No depression, no loss of interest in normal activity or change in sleep  pattern.     Blood pressure 118/76, height 5\' 11"  (1.803 m), weight 191 lb (86.637 kg), last menstrual period 12/09/2015.  Physical Exam:  HEENT:unremarkable Neck:Supple, midline, no thyroid megaly, no carotid bruits Lungs:  Clear to auscultation no rhonchi's or wheezes Heart:Regular rate and rhythm, no murmurs or gallops Breast Exam: Symmetrical in appearance no palpable mass or tenderness no supra clavicular axillary lymphadenopathy Abdomen: Soft nontender no rebound or guarding Pelvic:BUS within normal limits Vagina: No lesions or discharge Cervix: No lesions or discharge Uterus: Approximate 14 week size and firm Adnexa: No palpable masses or tenderness although limited due to size of uterus Extremities: No cords, no edema Rectal: Not performed  No results found for this or any previous visit (from the past 24 hour(s)).  No results found.  Assessment/Plan: 40 year old gravida 2 para 2 with symptomatic leiomyomatous uteri scheduled to undergo total abdominal hysterectomy with bilateral salpingectomy. She was provided with prescription for Percocet 5/325 take 1 by mouth every 4-6 hours when necessary postop. Also prescription for Reglan 10 mg take 1 by mouth every 6 hours when necessary nausea or vomiting postop. Risks for her surgery were discussed and outlined in Spanish as follows:                        Patient was counseled as to the risk of surgery to include the following:  1. Infection (prohylactic antibiotics will be administered)  2. DVT/Pulmonary Embolism (prophylactic pneumo compression stockings will be used)  3.Trauma to internal organs requiring additional surgical procedure to repair any injury to     Internal organs requiring perhaps additional hospitalization days.  4.Hemmorhage requiring transfusion and blood products which carry risks such as   anaphylactic reaction, hepatitis and AIDS  Patient had received literature information on the procedure scheduled  and all her questions were answered and fully accepts all risk.   [facility] HMD4:37 PMTD     Terrance Mass 12/26/2015, 3:59 PM

## 2016-01-03 NOTE — Anesthesia Preprocedure Evaluation (Signed)
Anesthesia Evaluation  Patient identified by MRN, date of birth, ID band Patient awake    Reviewed: Allergy & Precautions, H&P , NPO status , Patient's Chart, lab work & pertinent test results  Airway Mallampati: II  TM Distance: >3 FB Neck ROM: full    Dental no notable dental hx. (+) Dental Advisory Given, Teeth Intact   Pulmonary asthma ,  Mild asthma   Pulmonary exam normal breath sounds clear to auscultation       Cardiovascular Exercise Tolerance: Good negative cardio ROS Normal cardiovascular exam Rhythm:regular Rate:Normal     Neuro/Psych negative neurological ROS  negative psych ROS   GI/Hepatic negative GI ROS, Neg liver ROS,   Endo/Other  negative endocrine ROS  Renal/GU negative Renal ROS  negative genitourinary   Musculoskeletal   Abdominal   Peds  Hematology negative hematology ROS (+)   Anesthesia Other Findings   Reproductive/Obstetrics negative OB ROS                             Anesthesia Physical Anesthesia Plan  ASA: II  Anesthesia Plan: General   Post-op Pain Management:    Induction: Intravenous  Airway Management Planned: Oral ETT  Additional Equipment:   Intra-op Plan:   Post-operative Plan: Extubation in OR  Informed Consent: I have reviewed the patients History and Physical, chart, labs and discussed the procedure including the risks, benefits and alternatives for the proposed anesthesia with the patient or authorized representative who has indicated his/her understanding and acceptance.   Dental Advisory Given  Plan Discussed with: CRNA  Anesthesia Plan Comments:         Anesthesia Quick Evaluation

## 2016-01-03 NOTE — Interval H&P Note (Signed)
History and Physical Interval Note:  01/03/2016 8:44 AM  Denise Howell  has presented today for surgery, with the diagnosis of fibroid uterus, chronic anemia  The various methods of treatment have been discussed with the patient and family. After consideration of risks, benefits and other options for treatment, the patient has consented to  Procedure(s): HYSTERECTOMY ABDOMINAL WITH SALPINGECTOMY (Bilateral) as a surgical intervention .  The patient's history has been reviewed, patient examined, no change in status, stable for surgery.  I have reviewed the patient's chart and labs.  Questions were answered to the patient's satisfaction.     Terrance Mass

## 2016-01-04 ENCOUNTER — Encounter (HOSPITAL_COMMUNITY): Payer: Self-pay | Admitting: Gynecology

## 2016-01-04 DIAGNOSIS — D251 Intramural leiomyoma of uterus: Secondary | ICD-10-CM | POA: Diagnosis not present

## 2016-01-04 LAB — CBC
HCT: 34.4 % — ABNORMAL LOW (ref 36.0–46.0)
Hemoglobin: 11.3 g/dL — ABNORMAL LOW (ref 12.0–15.0)
MCH: 26.6 pg (ref 26.0–34.0)
MCHC: 32.8 g/dL (ref 30.0–36.0)
MCV: 80.9 fL (ref 78.0–100.0)
PLATELETS: 227 10*3/uL (ref 150–400)
RBC: 4.25 MIL/uL (ref 3.87–5.11)
RDW: 16.4 % — ABNORMAL HIGH (ref 11.5–15.5)
WBC: 8.4 10*3/uL (ref 4.0–10.5)

## 2016-01-04 MED ORDER — OXYCODONE-ACETAMINOPHEN 5-325 MG PO TABS
1.0000 | ORAL_TABLET | Freq: Four times a day (QID) | ORAL | Status: DC | PRN
  Administered 2016-01-04: 1 via ORAL
  Filled 2016-01-04: qty 1

## 2016-01-04 NOTE — Progress Notes (Signed)
Patient discharged to home. Discharge education/paperwork reviewed with patient. Patient has no questions at this time.

## 2016-01-04 NOTE — Addendum Note (Signed)
Addendum  created 01/04/16 0826 by Riki Sheer, CRNA   Modules edited: Clinical Notes   Clinical Notes:  File: [id number]

## 2016-01-04 NOTE — Discharge Summary (Signed)
Physician Discharge Summary  Patient ID: Denise Howell MRN: [id number] DOB/AGE: 12/01/1975 40 y.o.  Admit date: 01/03/2016 Discharge date: 01/04/2016  Admission Diagnoses:Symptomatic leiomyomatous uteri contributing to meno- menorrhagia and anemia   Discharge Diagnoses: Symptomatic leiomyomatous uteri contributing to menorrhagia and anemia Active Problems:   Postoperative state   Discharged Condition: good  Hospital Course: Patient was admitted to the hospital on June 27 she underwent a total abdominal hysterectomy with bilateral salpingectomy. Patient had an intraoperative blood loss of less than 150 cc. She did well overnight. She had a PCA pump with morphine which was discontinued at 5:00 this morning. Her Foley catheter was removed and she has been voiding spontaneously. She's had normal vital signs have been afebrile. Her diet had been advanced and she has been ambulating and ready to be discharged home today.  Consults: None  Significant Diagnostic Studies: labs: Preoperative hemoglobin 11.3 postop hemoglobin 11.3  Treatments: surgery: Total abdominal hysterectomy with bilateral salpingectomy  Discharge Exam: Blood pressure 104/66, pulse 70, temperature 97.7 F (36.5 C), temperature source Oral, resp. rate 20, height 5\' 11"  (1.803 m), weight 191 lb (86.637 kg), last menstrual period 12/09/2015, SpO2 99 %. General appearance: alert and cooperative Resp: clear to auscultation bilaterally Cardio: regular rate and rhythm, S1, S2 normal, no murmur, click, rub or gallop GI: soft, non-tender; bowel sounds normal; no masses,  no organomegaly Extremities: extremities normal, atraumatic, no cyanosis or edema  Incision intact  Pathology report pending  Disposition: Final discharge disposition not confirmed  Discharge Instructions    Call MD for:  difficulty breathing, headache or visual disturbances    Complete by:  As directed      Call MD for:  hives    Complete by:  As  directed      Call MD for:  persistant dizziness or light-headedness    Complete by:  As directed      Call MD for:  persistant nausea and vomiting    Complete by:  As directed      Call MD for:  redness, tenderness, or signs of infection (pain, swelling, redness, odor or green/yellow discharge around incision site)    Complete by:  As directed      Call MD for:  severe uncontrolled pain    Complete by:  As directed      Call MD for:  temperature >100.4    Complete by:  As directed      Diet general    Complete by:  As directed      Discharge instructions    Complete by:  As directed   Cita con Dr. Curt Bears 12 a las 10 AM. Histerectoma abdominal Cuidados posteriores a la ciruga (Hysterectomy, Abdominal, Care After) Por favor lea detenidamente las siguientes indicaciones. [place] prximas semanas. Estas indicaciones le proporcionan informacin general acerca de cmo deber cuidarse despus de la ciruga. El mdico podr darle instrucciones especficas. Aunque el tratamiento se ha planificado de acuerdo con las prcticas mdicas disponibles ms recientes, ocasionalmente pueden ocurrir complicaciones inevitables. Si tiene problemas o surgen preguntas luego de recibir el alta, por favor comunquese con su mdico. INSTRUCCIONES PARA EL CUIDADO DOMICILIARIO La curacin puede demorar algn tiempo. Puede sentir molestias, sensibilidad, hinchazn y hematomas en el sitio de la operacin, durante algunas semanas. Esto es normal y Teacher, English as a foreign language a medida que pase el [place].  Slo tome medicamentos de Radio broadcast assistant o prescriptos para Glass blower/designer, las molestias o bajar la fiebre segn las indicaciones de su mdico.  No tome aspirina. Esta puede ocasionar hemorragias.  No conduzca mientras toma analgsicos.  Siga las indicaciones de su mdico con respecto a la dieta, la actividad fsica, a levantar objetos, conducir el automvil y para las actividades en general.  Reanude su dieta  habitual, segn las indicaciones y permisos.  Descanse y duerma lo suficiente.  No utilice tampones, duchas vaginales ni tenga relaciones sexuales hasta que el profesional la autorice.  Cambie el vendaje tal como se le indic.  Tmese la [employer] por da. [place].  El mdico podr indicarle que se duche y no se d baos de inmersin durante Corporate treasurer.  No beba alcohol hasta que el mdico la autorice.  Si est constipada, tome un laxante suave, si el mdico la autoriza. Los lquidos y los alimentos que contengan salvado la ayudarn para el problema de la constipacin.  Trate de que alguien la acompae en su casa durante una o [place], para ayudarla con los quehaceres domsticos.  Asegrese que usted y su familia comprenden todo acerca de su operacin y Licensed conveyancer.Marland Kitchen  No firme ningn documento legal hasta que se sienta completamente bien.  Cumpla con todas las visitas de control, segn le indique su mdico.  SOLICITE ATENCIN MDICA SI: Presenta enrojecimiento, hinchazn o aumento del dolor en la herida.  Aparece pus en la herida.  Advierte un olor ftido que proviene de la herida o del vendaje.  Siente dolor u observa enrojecimiento e hinchazn en el sitio de la va intravenosa.  La herida se abre (los bordes no estn unidos)  Se siente mareada o sufre un desmayo.  Siente dolor o tiene una hemorragia al [employer].  Tiene diarrea.  Presenta nuseas o vmitos.  Margette Fast hemorragia vaginal anormal.  Aparece una erupcin cutnea.  Tiene alguna reaccin anormal o aparece una alergia por los medicamentos.  Necesita analgsicos ms fuertes para su dolor.  SOLICITE ATENCIN MDICA DE INMEDIATO SI: La temperatura se eleva por encima de 100.  Siente dolor abdominal.  Siente dolor en el pecho.  Le falta el aire.  Se desmaya.  Siente dolor, u observa hinchazn o enrojecimiento en la pierna.  Tiene una hemorragia vaginal abundante, con o sin cogulos.  Document  Released: 01/06/2007 Document Re-Released: 12/13/2009 [facility] Patient Information 2011 [place].   MEmacular degenerationNOW6/28/2017     Driving Restrictions    Complete by:  As directed   1 week     Increase activity slowly    Complete by:  As directed      Lifting restrictions    Complete by:  As directed   6 weeks     Sexual Activity Restrictions    Complete by:  As directed   6 weeks            Medication List    STOP taking these medications        tranexamic acid 650 MG Tabs tablet  Commonly known as:  LYSTEDA      TAKE these medications        albuterol 108 (90 Base) MCG/ACT inhaler  Commonly known as:  PROVENTIL HFA;VENTOLIN HFA  Inhale 2 puffs into the lungs every 6 (six) hours as needed for wheezing or shortness of breath.     ferrous sulfate 325 (65 FE) MG EC tablet  Take 1 tablet (325 mg total) by mouth 2 (two) times daily with a meal.     metoCLOPramide 10 MG tablet  Commonly known as:  REGLAN  Take 1  tablet (10 mg total) by mouth 3 (three) times daily with meals.     oxyCODONE-acetaminophen 5-325 MG tablet  Commonly known as:  PERCOCET  Take 1 tablet by mouth every 4 (four) hours as needed for severe pain.         SignedTerrance Mass 01/04/2016, 8:51 AM

## 2016-01-04 NOTE — Anesthesia Postprocedure Evaluation (Signed)
Anesthesia Post Note  Patient: Denise Howell  Procedure(s) Performed: Procedure(s) (LRB): HYSTERECTOMY ABDOMINAL WITH BILATERAL SALPINGECTOMY (Bilateral)  Patient location during evaluation: [facility] Anesthesia Type: General Level of consciousness: awake and alert Pain management: pain level controlled Vital Signs Assessment: post-procedure vital signs reviewed and stable Respiratory status: spontaneous breathing, nonlabored ventilation and respiratory function stable Cardiovascular status: blood pressure returned to baseline and stable Postop Assessment: no signs of nausea or vomiting Anesthetic complications: no     Last Vitals:  Filed Vitals:   01/04/16 0506 01/04/16 0559  BP: 104/66   Pulse: 70   Temp: 36.5 C   Resp: 16 20    Last Pain:  Filed Vitals:   01/04/16 0805  PainSc: 0-No pain   Pain Goal: Patients Stated Pain Goal: 0 (01/04/16 0802)               Riki Sheer

## 2016-01-11 ENCOUNTER — Other Ambulatory Visit: Payer: Self-pay | Admitting: Gynecology

## 2016-01-11 DIAGNOSIS — Z1231 Encounter for screening mammogram for malignant neoplasm of breast: Secondary | ICD-10-CM

## 2016-01-18 ENCOUNTER — Ambulatory Visit (INDEPENDENT_AMBULATORY_CARE_PROVIDER_SITE_OTHER): Payer: 59 | Admitting: Gynecology

## 2016-01-18 ENCOUNTER — Encounter: Payer: Self-pay | Admitting: Gynecology

## 2016-01-18 ENCOUNTER — Ambulatory Visit
Admission: RE | Admit: 2016-01-18 | Discharge: 2016-01-18 | Disposition: A | Discharge status: Home / Self Care | Payer: 59 | Source: Ambulatory Visit | Attending: Gynecology | Admitting: Gynecology

## 2016-01-18 VITALS — BP 120/78

## 2016-01-18 DIAGNOSIS — Z1231 Encounter for screening mammogram for malignant neoplasm of breast: Secondary | ICD-10-CM

## 2016-01-18 DIAGNOSIS — Z09 Encounter for follow-up examination after completed treatment for conditions other than malignant neoplasm: Secondary | ICD-10-CM

## 2016-01-18 NOTE — Progress Notes (Signed)
   Patient is a 40 year old on June 27 underwent a total abdominal hysterectomy with bilateral salpingectomy as a result of symptomatic leiomyomatous uteri contributing to menorrhagia and chronic anemia. Patient is doing well from her surgery. Pictures from surgery as well as pathology report and findings from her surgery were discussed as follows:  FINDINGS:globular 14 week size uterus large intramural myoma encroaching uterine cavity. Normal fallopian tubes and ovaries bilateral  Pathology report: Diagnosis Uterus, cervix and bilateral fallopian tubes MYOMETRIUM: LEIOMYOMAS ENDOMETRIUM: SECRETARY ENDOMETRIUM CERVIX: CHRONIC CERVICITIS AND ENDOCERVICAL POLYP BILATERAL FALLOPIAN TUBES: HISTOLOGICAL UNREMARKABLE  Preoperative hemoglobin 11.3 postop hemoglobin 11.3  Exam: Blood pressure 120/78 Gen. appearance well-developed well-nourished female in no acute distress Abdomen: Pfannenstiel incision healing very nicely 3 sutures were removed. As and was soft nontender no rebound or guarding Pelvic: Bartholin urethra Skene was within normal limits Vagina: Vaginal cuff intact Bimanual exam no palpable mass or tenderness Rectal exam not done  Assessment/plan: Patient 2 weeks status post total abdominal hysterectomy with bilateral salpingectomy as a result of symptomatic leiomyomatous uteri doing well. Patient will return back in 4 weeks for final postop visit.

## 2016-02-13 ENCOUNTER — Encounter: Payer: Self-pay | Admitting: Gynecology

## 2016-02-13 ENCOUNTER — Ambulatory Visit (INDEPENDENT_AMBULATORY_CARE_PROVIDER_SITE_OTHER): Payer: 59 | Admitting: Gynecology

## 2016-02-13 VITALS — BP 126/78

## 2016-02-13 DIAGNOSIS — Z09 Encounter for follow-up examination after completed treatment for conditions other than malignant neoplasm: Secondary | ICD-10-CM

## 2016-02-13 NOTE — Progress Notes (Signed)
   Patient is a 40 year old who presented to the office for her final 6 week postop visit. On June 27 underwent a total abdominal hysterectomy with bilateral salpingectomy as a result of symptomatic leiomyomatous uteri contributing to menorrhagia and chronic anemia. Patient is doing well from her surgery. Pictures from surgery as well as pathology report and findings from her surgery were discussed as follows:  FINDINGS:globular 14 week size uterus large intramural myoma encroaching uterine cavity. Normal fallopian tubes and ovaries bilateral  Pathology report: Diagnosis Uterus, cervix and bilateral fallopian tubes MYOMETRIUM: LEIOMYOMAS ENDOMETRIUM: SECRETARY ENDOMETRIUM CERVIX: CHRONIC CERVICITIS AND ENDOCERVICAL POLYP BILATERAL FALLOPIAN TUBES: HISTOLOGICAL UNREMARKABLE  Patient is asymptomatic doing well ready to return back to work.  Physical exam: Blood pressure 126/78 Abdomen: Soft nontender no rebound or guarding Pfannenstiel scar completely healed Pelvic: Bartholin urethra Skene was within normal limits Vaginal cuff intact Bimanual exam no palpable masses or tenderness Rectal exam not done  Assessment/plan: Patient 6 weeks status post abdominal hysterectomy with bilateral salpingectomy as a result of symptomatic leiomyomatous uteri has done well. Normal postoperative exam. Patient may return to work with no restrictions note provided. Patient scheduled to return back in one year for annual exam or when necessary.

## 2016-11-21 ENCOUNTER — Other Ambulatory Visit: Payer: Self-pay | Admitting: Gynecology

## 2016-11-21 ENCOUNTER — Encounter: Payer: Self-pay | Admitting: Gynecology

## 2016-11-21 DIAGNOSIS — Z1231 Encounter for screening mammogram for malignant neoplasm of breast: Secondary | ICD-10-CM

## 2016-12-28 ENCOUNTER — Ambulatory Visit (INDEPENDENT_AMBULATORY_CARE_PROVIDER_SITE_OTHER): Payer: 59 | Admitting: Gynecology

## 2016-12-28 ENCOUNTER — Encounter: Payer: Self-pay | Admitting: Gynecology

## 2016-12-28 VITALS — BP 120/78 | Ht 71.0 in | Wt 183.0 lb

## 2016-12-28 DIAGNOSIS — Z01419 Encounter for gynecological examination (general) (routine) without abnormal findings: Secondary | ICD-10-CM

## 2016-12-28 LAB — LIPID PANEL
CHOL/HDL RATIO: 4 ratio (ref <5.0)
CHOLESTEROL: 201 mg/dL — AB (ref <200)
HDL: 50 mg/dL — ABNORMAL LOW (ref >50)
LDL Cholesterol: 138 mg/dL — ABNORMAL HIGH (ref <100)
Triglycerides: 63 mg/dL (ref <150)
VLDL: 13 mg/dL (ref <30)

## 2016-12-28 LAB — CBC WITH DIFFERENTIAL/PLATELET
BASOS PCT: 1 %
Basophils Absolute: 56 cells/uL (ref 0–200)
EOS ABS: 224 {cells}/uL (ref 15–500)
Eosinophils Relative: 4 %
HEMATOCRIT: 39.7 % (ref 35.0–45.0)
Hemoglobin: 13.4 g/dL (ref 11.7–15.5)
LYMPHS ABS: 1792 {cells}/uL (ref 850–3900)
Lymphocytes Relative: 32 %
MCH: 29.5 pg (ref 27.0–33.0)
MCHC: 33.8 g/dL (ref 32.0–36.0)
MCV: 87.4 fL (ref 80.0–100.0)
MONO ABS: 392 {cells}/uL (ref 200–950)
MONOS PCT: 7 %
MPV: 9.7 fL (ref 7.5–12.5)
NEUTROS ABS: 3136 {cells}/uL (ref 1500–7800)
Neutrophils Relative %: 56 %
PLATELETS: 233 10*3/uL (ref 140–400)
RBC: 4.54 MIL/uL (ref 3.80–5.10)
RDW: 13.1 % (ref 11.0–15.0)
WBC: 5.6 10*3/uL (ref 3.8–10.8)

## 2016-12-28 LAB — COMPREHENSIVE METABOLIC PANEL
ALK PHOS: 71 U/L (ref 33–115)
ALT: 11 U/L (ref 6–29)
AST: 14 U/L (ref 10–30)
Albumin: 4 g/dL (ref 3.6–5.1)
BUN: 16 mg/dL (ref 7–25)
CO2: 23 mmol/L (ref 20–31)
Calcium: 9.1 mg/dL (ref 8.6–10.2)
Chloride: 105 mmol/L (ref 98–110)
Creat: 0.71 mg/dL (ref 0.50–1.10)
Glucose, Bld: 82 mg/dL (ref 65–99)
POTASSIUM: 4 mmol/L (ref 3.5–5.3)
Sodium: 137 mmol/L (ref 135–146)
TOTAL PROTEIN: 6.6 g/dL (ref 6.1–8.1)
Total Bilirubin: 0.5 mg/dL (ref 0.2–1.2)

## 2016-12-28 LAB — TSH: TSH: 1.3 mIU/L

## 2016-12-28 NOTE — Progress Notes (Signed)
Denise Howell 01-Sep-1975 [id number]   History:    41 y.o.  for annual gyn exam with no complaints today. Patient in 2017 had a total abdominal hysterectomy with bilateral salpingectomy as a result of symptomatic leiomyomatous uteri contributing to her menorrhagia and chronic anemia. She has done well from her surgery. She's here fasting for blood work today and schedule for next week for her mammogram. Patient with no previous history of any abnormal Pap smears.  Past medical history,surgical history, family history and social history were all reviewed and documented in the EPIC chart.  Gynecologic History Patient's last menstrual period was 12/09/2015. Contraception: status post hysterectomy Last Pap: 2015. Results were: normal Last mammogram: 2017. Results were: normal  Obstetric History OB History  Gravida Para Term Preterm AB Living  2 2       2   SAB TAB Ectopic Multiple Live Births               # Outcome Date GA Lbr Len/2nd Weight Sex Delivery Anes PTL Lv  2 Para           1 Para                ROS: A ROS was performed and pertinent positives and negatives are included in the history.  GENERAL: No fevers or chills. HEENT: No change in vision, no earache, sore throat or sinus congestion. NECK: No pain or stiffness. CARDIOVASCULAR: No chest pain or pressure. No palpitations. PULMONARY: No shortness of breath, cough or wheeze. GASTROINTESTINAL: No abdominal pain, nausea, vomiting or diarrhea, melena or bright red blood per rectum. GENITOURINARY: No urinary frequency, urgency, hesitancy or dysuria. MUSCULOSKELETAL: No joint or muscle pain, no back pain, no recent trauma. DERMATOLOGIC: No rash, no itching, no lesions. ENDOCRINE: No polyuria, polydipsia, no heat or cold intolerance. No recent change in weight. HEMATOLOGICAL: No anemia or easy bruising or bleeding. NEUROLOGIC: No headache, seizures, numbness, tingling or weakness. PSYCHIATRIC: No depression, no loss of interest  in normal activity or change in sleep pattern.     Exam: chaperone present  BP 120/78   Ht 5\' 11"  (1.803 m)   Wt 183 lb (83 kg)   LMP 12/09/2015   BMI 25.52 kg/m   Body mass index is 25.52 kg/m.  General appearance : Well developed well nourished female. No acute distress HEENT: Eyes: no retinal hemorrhage or exudates,  Neck supple, trachea midline, no carotid bruits, no thyroidmegaly Lungs: Clear to auscultation, no rhonchi or wheezes, or rib retractions  Heart: Regular rate and rhythm, no murmurs or gallops Breast:Examined in sitting and supine position were symmetrical in appearance, no palpable masses or tenderness,  no skin retraction, no nipple inversion, no nipple discharge, no skin discoloration, no axillary or supraclavicular lymphadenopathy Abdomen: no palpable masses or tenderness, no rebound or guarding Extremities: no edema or skin discoloration or tenderness  Pelvic:  Bartholin, Urethra, Skene Glands: Within normal limits             Vagina: No gross lesions or discharge  Cervix: Absent  Uterus  absent  Adnexa  Without masses or tenderness  Anus and perineum  normal   Rectovaginal  normal sphincter tone without palpated masses or tenderness             Hemoccult not indicated     Assessment/Plan:  41 y.o. female for annual exam status post total abdominal hysterectomy with bilateral salpingectomy in 2017 as a result of symptomatic leiomyomatous uteri doing well. She  is fasting today. The following fasting blood work was ordered today: Comprehensive metabolic panel, fasting lipid profile, CBC, TSH, and urinalysis. Pap smear done today. We explained to the patient that according to the new guidelines she will no longer need Pap smears. Patient to schedule mammogram the next few weeks.   Terrance Mass MD, 8:22 AM 12/28/2016

## 2016-12-28 NOTE — Addendum Note (Signed)
Addended by: Burnett Kanaris on: 12/28/2016 08:25 AM   Modules accepted: Orders

## 2016-12-29 LAB — URINALYSIS W MICROSCOPIC + REFLEX CULTURE
Bacteria, UA: NONE SEEN [HPF]
Bilirubin Urine: NEGATIVE
CASTS: NONE SEEN [LPF]
Crystals: NONE SEEN [HPF]
Glucose, UA: NEGATIVE
Ketones, ur: NEGATIVE
Leukocytes, UA: NEGATIVE
NITRITE: NEGATIVE
PH: 6 (ref 5.0–8.0)
PROTEIN: NEGATIVE
Specific Gravity, Urine: 1.021 (ref 1.001–1.035)
WBC, UA: NONE SEEN WBC/HPF (ref <=5)
YEAST: NONE SEEN [HPF]

## 2016-12-31 ENCOUNTER — Other Ambulatory Visit: Payer: Self-pay | Admitting: Gynecology

## 2016-12-31 DIAGNOSIS — R3129 Other microscopic hematuria: Secondary | ICD-10-CM

## 2016-12-31 LAB — URINE CULTURE: Organism ID, Bacteria: NO GROWTH

## 2017-01-01 LAB — PAP IG W/ RFLX HPV ASCU

## 2017-01-18 ENCOUNTER — Other Ambulatory Visit: Payer: 59

## 2017-01-18 ENCOUNTER — Ambulatory Visit
Admission: RE | Admit: 2017-01-18 | Discharge: 2017-01-18 | Disposition: A | Discharge status: Home / Self Care | Payer: 59 | Source: Ambulatory Visit | Attending: Gynecology | Admitting: Gynecology

## 2017-01-18 DIAGNOSIS — Z1231 Encounter for screening mammogram for malignant neoplasm of breast: Secondary | ICD-10-CM

## 2017-01-18 DIAGNOSIS — R3129 Other microscopic hematuria: Secondary | ICD-10-CM

## 2017-01-18 LAB — URINALYSIS W MICROSCOPIC + REFLEX CULTURE
BACTERIA UA: NONE SEEN [HPF]
BILIRUBIN URINE: NEGATIVE
CRYSTALS: NONE SEEN [HPF]
Casts: NONE SEEN [LPF]
Glucose, UA: NEGATIVE
KETONES UR: NEGATIVE
Leukocytes, UA: NEGATIVE
Nitrite: NEGATIVE
PROTEIN: NEGATIVE
RBC / HPF: NONE SEEN RBC/HPF (ref <=2)
SQUAMOUS EPITHELIAL / LPF: NONE SEEN [HPF] (ref <=5)
Specific Gravity, Urine: 1.022 (ref 1.001–1.035)
WBC UA: NONE SEEN WBC/HPF (ref <=5)
Yeast: NONE SEEN [HPF]
pH: 6 (ref 5.0–8.0)

## 2017-01-21 ENCOUNTER — Telehealth: Payer: Self-pay | Admitting: *Deleted

## 2017-01-21 NOTE — Telephone Encounter (Signed)
-----   Message from Terrance Mass, MD sent at 01/18/2017  4:32 PM EDT ----- Please inform patient that I would like to refer her to the urologist because of her persistent blood in her urine. Please make an appointment for her at [facility] urology

## 2017-01-21 NOTE — Telephone Encounter (Signed)
Left message for pt to call.

## 2017-01-29 NOTE — Telephone Encounter (Signed)
Pt informed, referral faxed to [facility]

## 2017-02-19 NOTE — Telephone Encounter (Signed)
Called and left detailed message on pt voicemail  asking her to call [facility] to schedule appointment as they have called and left message.

## 2017-02-21 NOTE — Telephone Encounter (Signed)
Pt scheduled on 04/02/17 @ 9:25am with Dr.Bell, pt aware.

## 2017-12-10 ENCOUNTER — Other Ambulatory Visit: Payer: Self-pay | Admitting: Obstetrics & Gynecology

## 2017-12-10 DIAGNOSIS — Z1231 Encounter for screening mammogram for malignant neoplasm of breast: Secondary | ICD-10-CM

## 2017-12-30 ENCOUNTER — Encounter: Payer: 59 | Admitting: Obstetrics & Gynecology

## 2018-01-10 ENCOUNTER — Ambulatory Visit (INDEPENDENT_AMBULATORY_CARE_PROVIDER_SITE_OTHER): Payer: 59 | Admitting: Obstetrics & Gynecology

## 2018-01-10 ENCOUNTER — Encounter: Payer: Self-pay | Admitting: Obstetrics & Gynecology

## 2018-01-10 VITALS — BP 120/78 | Ht 71.0 in | Wt 187.0 lb

## 2018-01-10 DIAGNOSIS — Z90710 Acquired absence of both cervix and uterus: Secondary | ICD-10-CM

## 2018-01-10 DIAGNOSIS — Z01419 Encounter for gynecological examination (general) (routine) without abnormal findings: Secondary | ICD-10-CM

## 2018-01-10 NOTE — Patient Instructions (Signed)
1. Well female exam with routine gynecological exam Gynecologic exam status post TAH bilateral salpingectomy.  Pap test -June 2018.  Will repeat Pap test at 3 years.  Breast exam normal.  Screening mammogram scheduled in 2 weeks.  Patient will follow-up here for fasting health labs.  Continue with regular physical activity and healthy nutrition.  Body mass index is 26.08, with good muscle mass. - CBC; Future - Comp Met (CMET); Future - TSH; Future - VITAMIN D 25 Hydroxy (Vit-D Deficiency, Fractures); Future - Lipid panel; Future  2. S/P TAH (total abdominal hysterectomy)  Francisca, it was a pleasure meeting you today!  I will inform you of your results as soon as they are available.

## 2018-01-10 NOTE — Progress Notes (Signed)
Denise Howell [birth year] [id number]   History:    42 y.o. G2P2L2 married.  Children Son 21 studying nursing at [employer], daughter 46 yo rising 9th grader.  RP:  Established patient presenting for annual gyn exam   HPI: Status post TAH bilateral salpingectomy for symptomatic fibroids.  No pelvic pain.  No menopausal symptoms.  No pain with intercourse.  Urine and bowel movements normal.  Breasts normal.  Screening mammogram scheduled in 2 weeks.  Body mass index 26.08.  Physically active.  Will follow up here for fasting health labs.  Past medical history,surgical history, family history and social history were all reviewed and documented in the EPIC chart.  Gynecologic History Patient's last menstrual period was 12/09/2015. Contraception: status post hysterectomy Last Pap: 12/2016. Results were: Negative Last mammogram: 01/2017. Results were: Negative Bone Density: Never Colonoscopy: Never  Obstetric History OB History  Gravida Para Term Preterm AB Living  2 2     0 2  SAB TAB Ectopic Multiple Live Births      0        # Outcome Date GA Lbr Len/2nd Weight Sex Delivery Anes PTL Lv  2 Para           1 Para              ROS: A ROS was performed and pertinent positives and negatives are included in the history.  GENERAL: No fevers or chills. HEENT: No change in vision, no earache, sore throat or sinus congestion. NECK: No pain or stiffness. CARDIOVASCULAR: No chest pain or pressure. No palpitations. PULMONARY: No shortness of breath, cough or wheeze. GASTROINTESTINAL: No abdominal pain, nausea, vomiting or diarrhea, melena or bright red blood per rectum. GENITOURINARY: No urinary frequency, urgency, hesitancy or dysuria. MUSCULOSKELETAL: No joint or muscle pain, no back pain, no recent trauma. DERMATOLOGIC: No rash, no itching, no lesions. ENDOCRINE: No polyuria, polydipsia, no heat or cold intolerance. No recent change in weight. HEMATOLOGICAL: No anemia or easy bruising  or bleeding. NEUROLOGIC: No headache, seizures, numbness, tingling or weakness. PSYCHIATRIC: No depression, no loss of interest in normal activity or change in sleep pattern.     Exam:   BP 120/78 (BP Location: Right Arm, Patient Position: Sitting, Cuff Size: Normal)   Ht 5' 11" (1.803 m)   Wt 187 lb (84.8 kg)   LMP 12/09/2015   BMI 26.08 kg/m   Body mass index is 26.08 kg/m.  General appearance : Well developed well nourished female. No acute distress HEENT: Eyes: no retinal hemorrhage or exudates,  Neck supple, trachea midline, no carotid bruits, no thyroidmegaly Lungs: Clear to auscultation, no rhonchi or wheezes, or rib retractions  Heart: Regular rate and rhythm, no murmurs or gallops Breast:Examined in sitting and supine position were symmetrical in appearance, no palpable masses or tenderness,  no skin retraction, no nipple inversion, no nipple discharge, no skin discoloration, no axillary or supraclavicular lymphadenopathy Abdomen: no palpable masses or tenderness, no rebound or guarding Extremities: no edema or skin discoloration or tenderness  Pelvic: Vulva: Normal             Vagina: No gross lesions or discharge  Cervix/Uterus absent  Adnexa  Without masses or tenderness  Anus: Normal   Assessment/Plan:  42 y.o. female for annual exam   1. Well female exam with routine gynecological exam Gynecologic exam status post TAH bilateral salpingectomy.  Pap test -June 2018.  Will repeat Pap test at 3 years.  Breast exam normal.  Screening mammogram scheduled in 2 weeks.  Patient will follow-up here for fasting health labs.  Continue with regular physical activity and healthy nutrition.  Body mass index is 26.08, with good muscle mass. - CBC; Future - Comp Met (CMET); Future - TSH; Future - VITAMIN D 25 Hydroxy (Vit-D Deficiency, Fractures); Future - Lipid panel; Future  2. S/P TAH (total abdominal hysterectomy)   Princess Bruins MD, 12:06 PM 01/10/2018

## 2018-01-16 IMAGING — MG DIGITAL SCREENING BILATERAL MAMMOGRAM WITH CAD
5 series · 5 of 5 positions shown · non-contrast
Comparison: Previous exam(s).

CLINICAL DATA: Screening.

EXAM:
DIGITAL SCREENING BILATERAL MAMMOGRAM WITH CAD

[R XCCL]
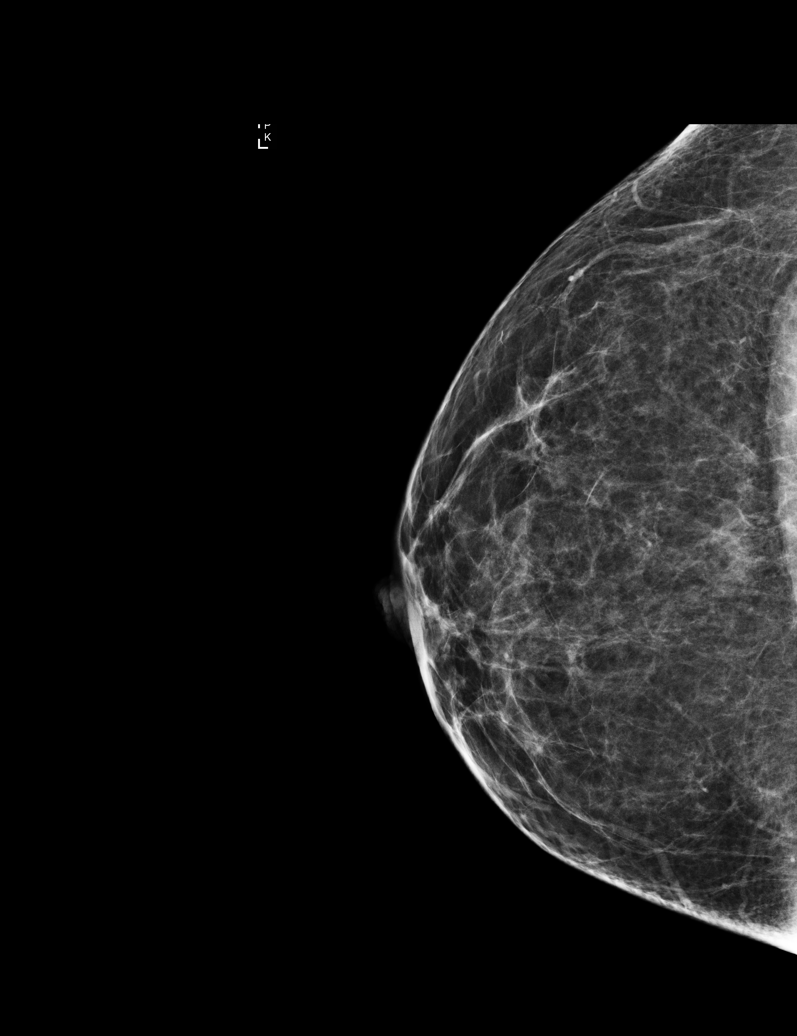

[R CC]
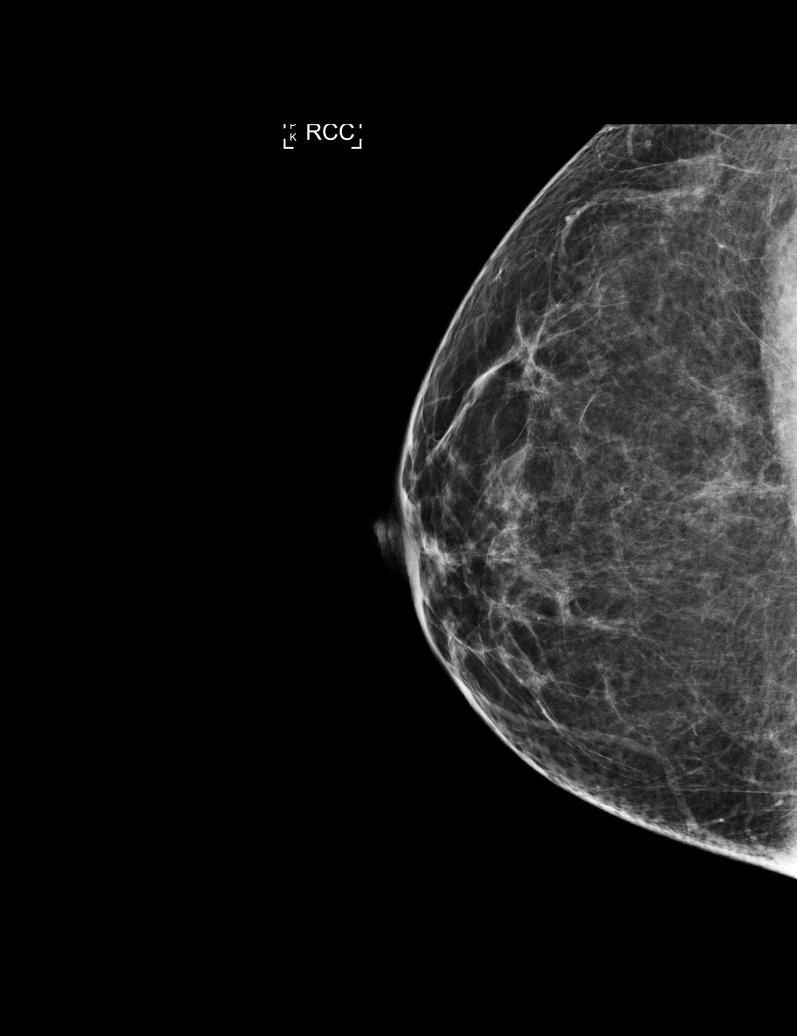

[R MLO]
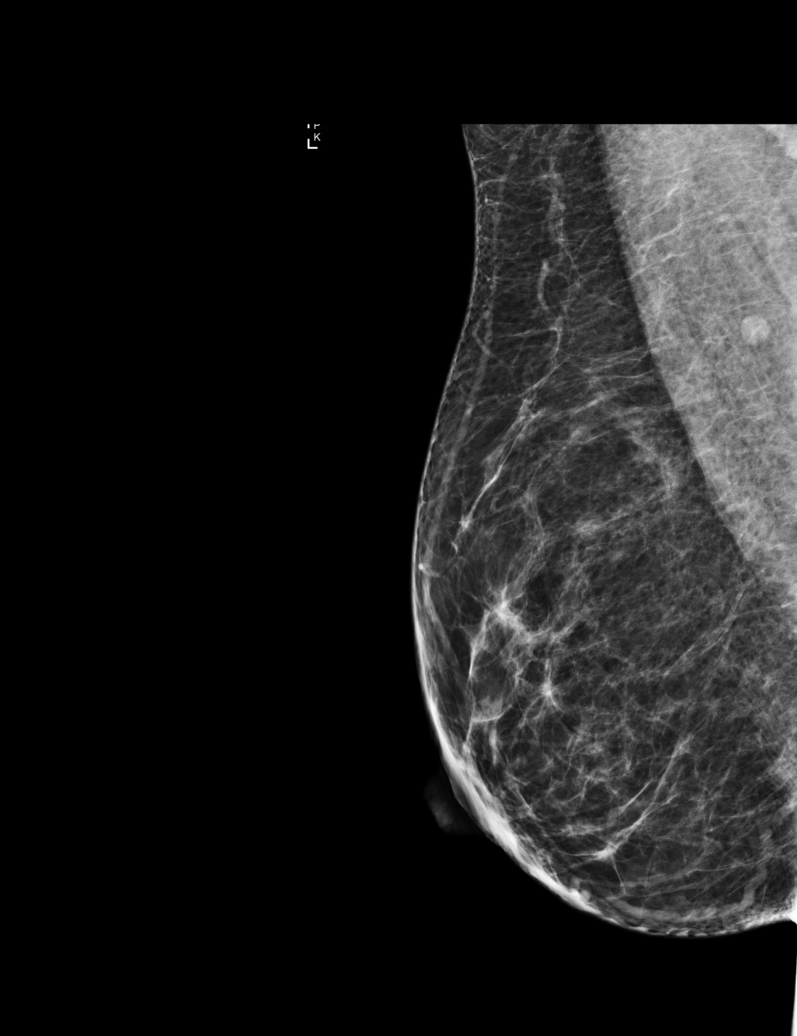

[L MLO]
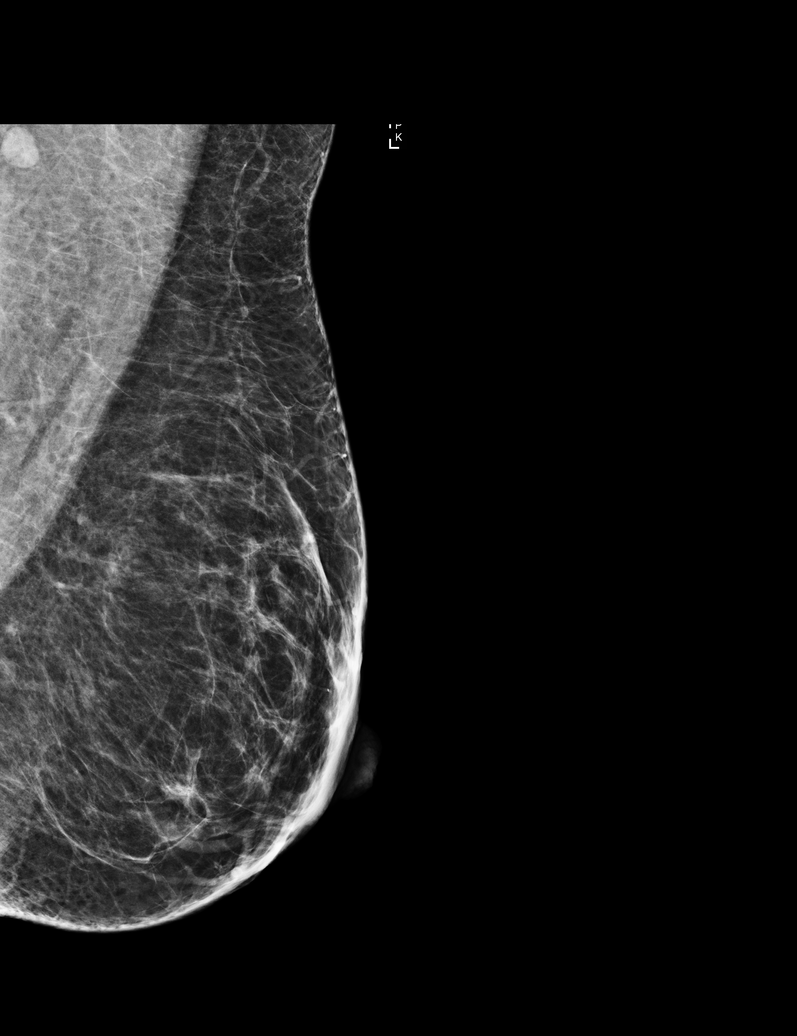

[L CC]
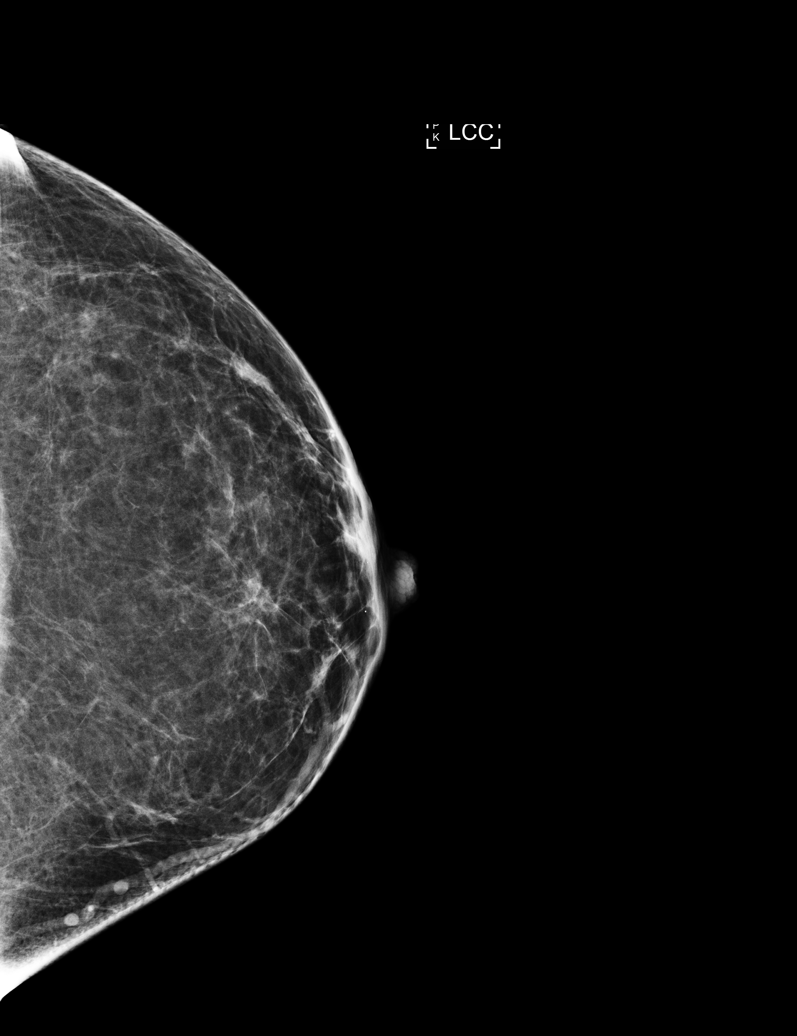

[5 of 5 positions shown; findings below may reference images not displayed]

ACR Breast Density Category b: There are scattered areas of
fibroglandular density.
FINDINGS: There are no findings suspicious for malignancy. Images were
processed with CAD.
IMPRESSION: No mammographic evidence of malignancy. A result letter of this
screening mammogram will be mailed directly to the patient.

RECOMMENDATION:
Screening mammogram in one year. (Code:AS-G-LCT)

BI-RADS CATEGORY  1: Negative.

## 2018-01-21 ENCOUNTER — Other Ambulatory Visit: Payer: 59

## 2018-01-21 DIAGNOSIS — Z01419 Encounter for gynecological examination (general) (routine) without abnormal findings: Secondary | ICD-10-CM

## 2018-01-22 LAB — LIPID PANEL
CHOL/HDL RATIO: 4.4 (calc) (ref <5.0)
Cholesterol: 239 mg/dL — ABNORMAL HIGH (ref <200)
HDL: 54 mg/dL (ref >50)
LDL Cholesterol (Calc): 167 mg/dL (calc) — ABNORMAL HIGH
NON-HDL CHOLESTEROL (CALC): 185 mg/dL — AB (ref <130)
Triglycerides: 75 mg/dL (ref <150)

## 2018-01-22 LAB — COMPREHENSIVE METABOLIC PANEL
AG RATIO: 1.5 (calc) (ref 1.0–2.5)
ALBUMIN MSPROF: 4.2 g/dL (ref 3.6–5.1)
ALT: 9 U/L (ref 6–29)
AST: 11 U/L (ref 10–30)
Alkaline phosphatase (APISO): 67 U/L (ref 33–115)
BUN: 13 mg/dL (ref 7–25)
CALCIUM: 9.2 mg/dL (ref 8.6–10.2)
CO2: 24 mmol/L (ref 20–32)
Chloride: 105 mmol/L (ref 98–110)
Creat: 0.75 mg/dL (ref 0.50–1.10)
GLUCOSE: 88 mg/dL (ref 65–99)
Globulin: 2.8 g/dL (calc) (ref 1.9–3.7)
POTASSIUM: 4.1 mmol/L (ref 3.5–5.3)
SODIUM: 137 mmol/L (ref 135–146)
TOTAL PROTEIN: 7 g/dL (ref 6.1–8.1)
Total Bilirubin: 0.5 mg/dL (ref 0.2–1.2)

## 2018-01-22 LAB — CBC
HCT: 42.2 % (ref 35.0–45.0)
Hemoglobin: 14.4 g/dL (ref 11.7–15.5)
MCH: 29.8 pg (ref 27.0–33.0)
MCHC: 34.1 g/dL (ref 32.0–36.0)
MCV: 87.4 fL (ref 80.0–100.0)
MPV: 10 fL (ref 7.5–12.5)
PLATELETS: 246 10*3/uL (ref 140–400)
RBC: 4.83 10*6/uL (ref 3.80–5.10)
RDW: 12.4 % (ref 11.0–15.0)
WBC: 6.3 10*3/uL (ref 3.8–10.8)

## 2018-01-22 LAB — VITAMIN D 25 HYDROXY (VIT D DEFICIENCY, FRACTURES): Vit D, 25-Hydroxy: 20 ng/mL — ABNORMAL LOW (ref 30–100)

## 2018-01-22 LAB — TSH: TSH: 1.66 m[IU]/L

## 2018-01-24 ENCOUNTER — Ambulatory Visit
Admission: RE | Admit: 2018-01-24 | Discharge: 2018-01-24 | Disposition: A | Discharge status: Home / Self Care | Payer: 59 | Source: Ambulatory Visit | Attending: Obstetrics & Gynecology | Admitting: Obstetrics & Gynecology

## 2018-01-24 DIAGNOSIS — Z1231 Encounter for screening mammogram for malignant neoplasm of breast: Secondary | ICD-10-CM

## 2018-01-29 ENCOUNTER — Other Ambulatory Visit: Payer: Self-pay | Admitting: Obstetrics & Gynecology

## 2018-01-29 DIAGNOSIS — E559 Vitamin D deficiency, unspecified: Secondary | ICD-10-CM

## 2018-01-29 MED ORDER — VITAMIN D (ERGOCALCIFEROL) 1.25 MG (50000 UNIT) PO CAPS: 50000.0000 [IU] | ORAL_CAPSULE | ORAL | 0 refills | Status: DC

## 2018-05-14 ENCOUNTER — Other Ambulatory Visit: Payer: 59

## 2018-05-19 ENCOUNTER — Other Ambulatory Visit: Payer: 59

## 2018-05-19 DIAGNOSIS — E559 Vitamin D deficiency, unspecified: Secondary | ICD-10-CM

## 2018-05-20 LAB — VITAMIN D 25 HYDROXY (VIT D DEFICIENCY, FRACTURES): VIT D 25 HYDROXY: 28 ng/mL — AB (ref 30–100)

## 2019-01-05 ENCOUNTER — Other Ambulatory Visit: Payer: Self-pay | Admitting: Obstetrics & Gynecology

## 2019-01-05 DIAGNOSIS — Z1231 Encounter for screening mammogram for malignant neoplasm of breast: Secondary | ICD-10-CM

## 2019-02-12 ENCOUNTER — Ambulatory Visit: Payer: 59

## 2019-03-27 ENCOUNTER — Ambulatory Visit: Payer: 59

## 2019-05-08 ENCOUNTER — Ambulatory Visit: Payer: 59

## 2019-05-19 ENCOUNTER — Other Ambulatory Visit: Payer: Self-pay | Admitting: Obstetrics & Gynecology

## 2019-05-19 DIAGNOSIS — R928 Other abnormal and inconclusive findings on diagnostic imaging of breast: Secondary | ICD-10-CM

## 2019-05-25 ENCOUNTER — Ambulatory Visit: Payer: 59

## 2019-05-25 ENCOUNTER — Other Ambulatory Visit: Payer: Self-pay

## 2019-05-25 ENCOUNTER — Ambulatory Visit
Admission: RE | Admit: 2019-05-25 | Discharge: 2019-05-25 | Disposition: A | Discharge status: Home / Self Care | Payer: 59 | Source: Ambulatory Visit | Attending: Obstetrics & Gynecology | Admitting: Obstetrics & Gynecology

## 2019-05-25 DIAGNOSIS — R928 Other abnormal and inconclusive findings on diagnostic imaging of breast: Secondary | ICD-10-CM

## 2019-10-21 ENCOUNTER — Encounter: Payer: 59 | Admitting: Obstetrics & Gynecology

## 2019-11-26 ENCOUNTER — Encounter: Payer: 59 | Admitting: Obstetrics & Gynecology

## 2020-02-22 ENCOUNTER — Encounter: Payer: 59 | Admitting: Obstetrics & Gynecology

## 2020-05-18 ENCOUNTER — Other Ambulatory Visit: Payer: Self-pay | Admitting: Obstetrics & Gynecology

## 2020-05-18 DIAGNOSIS — Z1231 Encounter for screening mammogram for malignant neoplasm of breast: Secondary | ICD-10-CM

## 2020-05-20 ENCOUNTER — Encounter: Payer: 59 | Admitting: Obstetrics & Gynecology

## 2020-06-17 ENCOUNTER — Encounter: Payer: 59 | Admitting: Obstetrics & Gynecology

## 2020-06-30 ENCOUNTER — Other Ambulatory Visit: Payer: Self-pay

## 2020-06-30 ENCOUNTER — Ambulatory Visit
Admission: RE | Admit: 2020-06-30 | Discharge: 2020-06-30 | Disposition: A | Discharge status: Home / Self Care | Payer: 59 | Source: Ambulatory Visit | Attending: Obstetrics & Gynecology | Admitting: Obstetrics & Gynecology

## 2020-06-30 DIAGNOSIS — Z1231 Encounter for screening mammogram for malignant neoplasm of breast: Secondary | ICD-10-CM

## 2020-08-17 ENCOUNTER — Encounter: Payer: 59 | Admitting: Obstetrics & Gynecology

## 2020-09-01 ENCOUNTER — Encounter: Payer: Self-pay | Admitting: Obstetrics & Gynecology

## 2020-09-01 ENCOUNTER — Other Ambulatory Visit: Payer: Self-pay

## 2020-09-01 ENCOUNTER — Ambulatory Visit (INDEPENDENT_AMBULATORY_CARE_PROVIDER_SITE_OTHER): Payer: 59 | Admitting: Obstetrics & Gynecology

## 2020-09-01 VITALS — BP 128/84 | Ht 70.5 in | Wt 175.0 lb

## 2020-09-01 DIAGNOSIS — Z5321 Procedure and treatment not carried out due to patient leaving prior to being seen by health care provider: Secondary | ICD-10-CM

## 2020-09-02 ENCOUNTER — Ambulatory Visit: Payer: 59 | Admitting: Obstetrics & Gynecology

## 2020-11-18 ENCOUNTER — Ambulatory Visit: Payer: 59 | Admitting: Obstetrics and Gynecology

## 2021-06-16 ENCOUNTER — Other Ambulatory Visit: Payer: Self-pay | Admitting: Obstetrics & Gynecology

## 2021-06-16 DIAGNOSIS — Z1231 Encounter for screening mammogram for malignant neoplasm of breast: Secondary | ICD-10-CM

## 2021-07-11 ENCOUNTER — Encounter: Payer: Self-pay | Admitting: Hematology

## 2021-07-18 ENCOUNTER — Ambulatory Visit: Payer: 59

## 2021-07-21 ENCOUNTER — Ambulatory Visit: Payer: Self-pay

## 2021-08-04 ENCOUNTER — Ambulatory Visit
Admission: RE | Admit: 2021-08-04 | Discharge: 2021-08-04 | Disposition: A | Discharge status: Home / Self Care | Payer: PRIVATE HEALTH INSURANCE | Source: Ambulatory Visit | Attending: Obstetrics & Gynecology | Admitting: Obstetrics & Gynecology

## 2021-08-04 ENCOUNTER — Encounter: Payer: Self-pay | Admitting: Hematology

## 2021-08-04 ENCOUNTER — Other Ambulatory Visit: Payer: Self-pay | Admitting: Obstetrics & Gynecology

## 2021-08-04 DIAGNOSIS — Z1231 Encounter for screening mammogram for malignant neoplasm of breast: Secondary | ICD-10-CM

## 2021-08-04 DIAGNOSIS — R928 Other abnormal and inconclusive findings on diagnostic imaging of breast: Secondary | ICD-10-CM

## 2021-08-23 ENCOUNTER — Ambulatory Visit: Payer: PRIVATE HEALTH INSURANCE

## 2021-08-23 ENCOUNTER — Ambulatory Visit
Admission: RE | Admit: 2021-08-23 | Discharge: 2021-08-23 | Disposition: A | Discharge status: Home / Self Care | Payer: PRIVATE HEALTH INSURANCE | Source: Ambulatory Visit | Attending: Obstetrics & Gynecology | Admitting: Obstetrics & Gynecology

## 2021-08-23 DIAGNOSIS — R928 Other abnormal and inconclusive findings on diagnostic imaging of breast: Secondary | ICD-10-CM

## 2021-09-07 ENCOUNTER — Encounter: Payer: Self-pay | Admitting: Hematology

## 2021-10-19 ENCOUNTER — Ambulatory Visit: Payer: PRIVATE HEALTH INSURANCE | Admitting: Internal Medicine

## 2022-01-08 ENCOUNTER — Ambulatory Visit: Payer: PRIVATE HEALTH INSURANCE | Admitting: Internal Medicine

## 2022-03-29 ENCOUNTER — Ambulatory Visit: Payer: PRIVATE HEALTH INSURANCE | Admitting: Internal Medicine

## 2022-06-12 ENCOUNTER — Ambulatory Visit: Payer: PRIVATE HEALTH INSURANCE | Admitting: Internal Medicine

## 2022-07-05 ENCOUNTER — Other Ambulatory Visit: Payer: Self-pay | Admitting: *Deleted

## 2022-07-05 DIAGNOSIS — Z1231 Encounter for screening mammogram for malignant neoplasm of breast: Secondary | ICD-10-CM

## 2022-08-22 ENCOUNTER — Ambulatory Visit: Payer: PRIVATE HEALTH INSURANCE | Admitting: Internal Medicine

## 2022-08-23 ENCOUNTER — Ambulatory Visit
Admission: RE | Admit: 2022-08-23 | Discharge: 2022-08-23 | Disposition: A | Discharge status: Home / Self Care | Payer: PRIVATE HEALTH INSURANCE | Source: Ambulatory Visit | Attending: *Deleted | Admitting: *Deleted

## 2022-08-23 ENCOUNTER — Other Ambulatory Visit: Payer: Self-pay | Admitting: Obstetrics and Gynecology

## 2022-08-23 DIAGNOSIS — Z1231 Encounter for screening mammogram for malignant neoplasm of breast: Secondary | ICD-10-CM

## 2023-09-13 ENCOUNTER — Other Ambulatory Visit: Payer: Self-pay | Admitting: Obstetrics and Gynecology

## 2023-09-13 DIAGNOSIS — Z1231 Encounter for screening mammogram for malignant neoplasm of breast: Secondary | ICD-10-CM
